# Patient Record
Sex: Male | Born: 1990 | Race: White | Hispanic: No | Marital: Single | Smoking: Former smoker
Health system: Southern US, Community
[De-identification: ages and names within clinical notes are randomized; demographics above are authoritative.]

## PROBLEM LIST (undated history)

## (undated) DIAGNOSIS — J342 Deviated nasal septum: Secondary | ICD-10-CM

## (undated) DIAGNOSIS — K219 Gastro-esophageal reflux disease without esophagitis: Secondary | ICD-10-CM

## (undated) DIAGNOSIS — Z9889 Other specified postprocedural states: Secondary | ICD-10-CM

## (undated) DIAGNOSIS — J069 Acute upper respiratory infection, unspecified: Secondary | ICD-10-CM

## (undated) HISTORY — PX: SHOULDER ARTHROSCOPY: SHX128

## (undated) HISTORY — PX: KNEE ARTHROSCOPY: SUR90

## (undated) HISTORY — DX: Acute upper respiratory infection, unspecified: J06.9

## (undated) HISTORY — PX: WISDOM TOOTH EXTRACTION: SHX21

## (undated) HISTORY — PX: FRACTURE SURGERY: SHX138

---
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
## 2010-01-08 ENCOUNTER — Ambulatory Visit
Admission: RE | Admit: 2010-01-08 | Discharge: 2010-01-08 | Payer: Self-pay | Source: Home / Self Care | Attending: Pulmonary Disease | Admitting: Pulmonary Disease

## 2010-01-08 ENCOUNTER — Encounter: Payer: Self-pay | Admitting: Pulmonary Disease

## 2010-01-08 DIAGNOSIS — R059 Cough, unspecified: Secondary | ICD-10-CM | POA: Insufficient documentation

## 2010-01-08 DIAGNOSIS — R05 Cough: Secondary | ICD-10-CM | POA: Insufficient documentation

## 2010-01-08 DIAGNOSIS — J45909 Unspecified asthma, uncomplicated: Secondary | ICD-10-CM | POA: Insufficient documentation

## 2010-02-04 NOTE — Miscellaneous (Signed)
  Clinical Lists Changes  Medications: Added new medication of EPIPEN 0.3 MG/0.3ML DEVI (EPINEPHRINE) as directed - Signed Rx of EPIPEN 0.3 MG/0.3ML DEVI (EPINEPHRINE) as directed;  #1 x 0;  Signed;  Entered by: Barbaraann Share MD;  Authorized by: Barbaraann Share MD;  Method used: Electronically to [facility] [address]. [phone]*, [address], [place], [place], [place]  [address], Ph: [phone] or [phone], Fax: [phone] pt with severe allergy to naprosyn, and s/p recent shoulder surgery.  Has been placed on meloxicam, and needs to have epi pen on hand in the event of cross reactivity to meloxicam.  Will call in new prescription for epi pen.   Prescriptions: EPIPEN 0.3 MG/0.3ML DEVI (EPINEPHRINE) as directed  #1 x 0   Entered and Authorized by:   Barbaraann Share MD   Signed by:   Barbaraann Share MD on 07/05/2009   Method used:   Electronically to        [facility] [address] [phone]* (retail)       [address]       [place], [place]  [address]       Ph: [phone] or [phone]       Fax: [phone]   RxID:   [id number]

## 2010-02-04 NOTE — Assessment & Plan Note (Signed)
Summary: chronic cough/cxr prior to ov/la   CC:  15 month ROV & eval for chronic cough....  History of Present Illness: 20 y/o WM, son of Dr. Marcelyn Bruins, and a soph at [employer]...     ~  Oct10:  states he noted the onset of sore throat  ~1week ago w/ swollen lymph nodes, low grade fever, feeling poorly, etc... also had cough- small amt of yellow brown mucous, sinus congestion, drainage & ears clogged...  went to student health w/ ZPak given & he completed this yest w/ some improvement overall but persistant sore throat, "sweeling" sensation, few tender LN's, congestion... cough & phlegm have improved- all resolved w/ Zpak, Medrol, Chlorseptic, etc...   ~  January 08, 2010:  asked to check Shayon before his ret to [employer] [facility] [employer] [facility] due to cough>  really present x yrs w/ prev eval as pre-teen by DrKozlow (neg allergy testing, neg sinus eval, & norm PFT's at that time), GI- DrMagod (neg UGI & EGD), & [facility]- abn 24H pH probe & some improvement w/ vigorous antireflux regimen (supposed to still be taking Zyrtek & Prilosec OTC daily... cough is a dry non-productive irritative cough- w/o sputum or hemoptysis, notes sl drainage, no f/c/s, etc... he denies CP, palpit, SOB, or any prob w/ activities... on note he was taking Prilosec in AM on empty stomach w/o eating after, not on any inhaled meds etc... we decided to check CXR (Clear, WNL);  and PFT (?mild obstructive defect); therefore we elected to try 82mo vigorous regimen ==> elev HOB if poss, take Dexilant before 1st meal, don't eat/ drink within 4H of retiring, ASMANEX 1sp Qhs, Nasonex Qhs, & Saline Prn daytime...   Current Problems:   ? of ALLERGIC RHINITIS (ICD-477.9) - he had neg allergy testing by DrKozlow in the past...  ~  1/12: we discussed trial Rx w/ Zyrtek Qam, Saline during the day, & NASONEX Qhs...  ACID REFLUX DISEASE (ICD-530.81) - hx chronic cough age 73 w/ eval by [facility]- pH probe was pos for reflux and symptoms improved w/ PPI therapy... he had  neg EGD/ UGI from [facility]... abn 24H pH probe test... sl delay on Gastric Emptying Scan 2009 (31% retension at [facility])... he was intermittent about PPI Rx & tended to take it early AM w/o eating afterwards...  ~  1/12:  we decided to try DEXILANT 60mg  daily- 30 min before 1st meal of the day; elev HOB; nothing to eat/ drink up to 4H prior to bed.  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 he also notes that PCN upsets his stomach...   Preventive Screening-Counseling & Management  Alcohol-Tobacco     Smoking Status: never  Allergies: 1)  ! Naprosyn 2)  ! Penicillin  Comments:  Nurse/Medical Assistant: The patient's medications and allergies were reviewed with the patient and were updated in the Medication and Allergy Lists.  Past History:  Past Medical History: ? 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 Father- Haroon Shatto - age 14, hx HBP & Chol... Mother - Latrelle Dodrill - age 79, hx PAT... 1 Sibling: sister in good health  Social History: Reviewed history from 10/20/2008 and no changes required. never smoked no alcohol  current student @  [employer] son of Dr. Marcelyn Bruins Smoking Status:  never  Review of Systems      See HPI       The patient complains of prolonged cough.  The patient denies anorexia, fever, weight loss, weight gain, vision loss, decreased hearing, hoarseness, chest pain, syncope, dyspnea on exertion, peripheral edema, headaches, hemoptysis, abdominal pain, melena, hematochezia, severe indigestion/heartburn, hematuria, incontinence, muscle weakness, suspicious skin lesions, transient blindness, difficulty  walking, depression, unusual weight change, abnormal bleeding, enlarged lymph nodes, and angioedema.    Vital Signs:  Patient profile:   20 year old male Height:      72 inches Weight:      156 pounds BMI:     21.23 O2 Sat:      97 % on Room air Temp:     99.1 degrees F oral Pulse rate:   103 / minute BP sitting:   110 / 74  (right arm) Cuff size:   regular  Vitals Entered By: Randell Loop CMA (January 08, 2010 2:11 PM)  O2 Sat at Rest %:  97 O2 Flow:  Room air CC: 15 month ROV & eval for chronic cough... Is Patient Diabetic? No Pain Assessment Patient in pain? no      Comments meds updated today with pt   Physical Exam  Additional Exam:  WD, WN, 20 y/o WM in NAD... GENERAL:  Alert & oriented; pleasant & cooperative. HEENT:  [place]/AT, EOM-wnl, PERRLA, Fundi-benign, EACs-clear x sl wax, TMs-wnl, NOSE-clear, THROAT- sl red w/o exud... NECK:  Supple w/ full ROM; no JVD; normal carotid impulses w/o bruits; no thyromegaly or nodules palpated; no adenopathy. CHEST:  Clear to P & A; without wheezes/ rales/ or rhonchi heard... HEART:  Regular Rhythm; without murmurs/ rubs/ or gallops detected... ABDOMEN:  Soft & nontender; normal bowel sounds; no organomegaly or masses palapted... EXT: without deformities or arthritic changes; no varicose veins/ venous insuffic/ or edema. NEURO:  CN's intact; motor testing normal; sensory testing normal; gait normal & balance OK. DERM:  No lesions noted; no rash etc...    CXR  Procedure date:  01/08/2010  Findings:      CHEST - 2 VIEW Comparison: None.   Findings: The lung fields are well expanded.  Heart and mediastinal contours are within normal limits.  The lung fields appear clear with no signs of focal infiltrate or congestive failure.  No pleural fluid or peribronchial cuffing is seen.   Bony structures are intact.   IMPRESSION: Normal cardiopulmonary appearance   Read By:  Bertha Stakes,  M.D.   Pre-Spirometry FVC     Value: 5.73 L/min   Pred: 5.96 L/min     % Pred: 96 % FEV1    Value: 3.31 L     Pred: 4.96 L     % Pred: 67 % FEV1/FVC  Value: 58 %     Pred: 84 %     % Pred: -- % FEF 25-75  Value: 2.73 L/min   Pred: 5.21 L/min     % Pred: 52 %  Comments: Spirometry reveals mild-mod airflow obstruction...  SN  Impression & Recommendations:  Problem # 1:  REACTIVE AIRWAY DISEASE (ICD-493.90) PFT indicates some underlying RADS> ?reflux related, ?sinus related, ?intrinsic... cough is his major symptom >> we discussed a one month trial of multisys therapy designed to see if we can diminish his cough reaction... in this regard we have asked him to: 1) attack reflux w/ Dexilant 60mg  daily- before the  1st meal of the day; elev HOB if able; don't eat/ drink w/thin 4H of retiring... 2) Rx sinus w/ Zyrtek Qam, use Saline Q1-2H during the day fopr congestion; use NASONEX Qhs... 3) Rx RADS w/ ASMANEX 1 sp at bedtime...  His updated medication list for this problem includes:    Asmanex 30 Metered Doses 220 Mcg/inh Aepb (Mometasone furoate) ..... One inhalation at bedtime...  Problem # 2:  ? of ALLERGIC RHINITIS (ICD-477.9) As above... His updated medication list for this problem includes:    Zyrtec Allergy 10 Mg Tabs (Cetirizine hcl) .Marland Kitchen... Take 1 tablet by mouth once a day    Nasonex 50 Mcg/act Susp (Mometasone furoate) .Marland Kitchen... 2 sprays in each nostril at bedtime...  Problem # 3:  ACID REFLUX DISEASE (ICD-530.81) As above... His updated medication list for this problem includes:    Dexilant 60 Mg Cpdr (Dexlansoprazole) .Marland Kitchen... Take 1 cap by mouth once daily- 30 min before the evening meal...  Complete Medication List: 1)  Epipen 0.3 Mg/0.6ml Devi (Epinephrine) .... As directed 2)  Zyrtec Allergy 10 Mg Tabs (Cetirizine hcl) .... Take 1 tablet by mouth once a day 3)  Dexilant 60 Mg Cpdr (Dexlansoprazole) .... Take 1 cap by mouth once daily- 30 min before the evening meal... 4)  Nasonex 50 Mcg/act Susp  (Mometasone furoate) .... 2 sprays in each nostril at bedtime.Marland KitchenMarland Kitchen 5)  Asmanex 30 Metered Doses 220 Mcg/inh Aepb (Mometasone furoate) .... One inhalation at bedtime...  Other Orders: T-2 View CXR (71020TC) No Charge Patient Arrived (NCPA0) (NCPA0)  Patient Instructions: 1)  To try and make a dent in your chronic cough problem- let's resolve to really give this treatment course a thorough trial over the next month: 2)  take the [facility] one inhalation at bedtime.Marland KitchenMarland Kitchen 3)  use the NASONEX 2 sprays in each nostril at bedtime... (you may also sray your nose in the AM & all day long w/ a SALINE nasal spray if you are stopped-up). 4)  take the DEXILANT one capsule 30 min before the evening meal...  5)  it would also be beneficial to elevate the head of your bed on 6" blocks... 6)  Let me know how well you were able to adhere to this regimen & how it worked for your cough & airway symptoms, then we can decide on treatment going forward.Marland KitchenMarland Kitchen

## 2010-03-22 LAB — COMPREHENSIVE METABOLIC PANEL
ALT: 13 U/L (ref 0–53)
AST: 19 U/L (ref 0–37)
CO2: 28 mEq/L (ref 19–32)
Calcium: 9.9 mg/dL (ref 8.4–10.5)
GFR calc Af Amer: >60 mL/min (ref >60)
Sodium: 137 mEq/L (ref 135–145)
Total Protein: 7.1 g/dL (ref 6.0–8.3)

## 2010-03-22 LAB — CBC
MCHC: 35.1 g/dL (ref 30.0–36.0)
RBC: 4.73 MIL/uL (ref 4.22–5.81)
RDW: 12.4 % (ref 11.5–15.5)

## 2010-03-22 LAB — URINALYSIS, ROUTINE W REFLEX MICROSCOPIC
Glucose, UA: NEGATIVE mg/dL
Specific Gravity, Urine: 1.014 (ref 1.005–1.030)
pH: 7.5 (ref 5.0–8.0)

## 2010-05-18 NOTE — Op Note (Signed)
Roberto Thomas, Roberto Thomas                 ACCOUNT NO.:  [id number]   MEDICAL RECORD NO.:  [id number]          PATIENT TYPE:  AMB   LOCATION:  ENDO                         FACILITY:  [facility]   PHYSICIAN:  Petra Kuba, M.D.    DATE OF BIRTH:  Feb 05, 1990   DATE OF PROCEDURE:  12/31/2007  DATE OF DISCHARGE:                               OPERATIVE REPORT   PROCEDURE:  Esophagogastroduodenoscopy with biopsy.   INDICATION:  Patient with nausea and vomiting, reflux.   Consent was signed after risks, benefits, methods, options thoroughly  discussed with both the patient and his mother and his father in the  past.   MEDICINES USED:  1. Fentanyl 75 mcg.  2. Versed 5 mg.   PROCEDURE:  Video endoscope was inserted by direct vision.  The  esophagus was normal.  The scope passed into the stomach and some old  vegetable matter was seen in the greater curve, small to medium in  amount.  The scope passed through a normal antrum, normal pylorus into a  normal duodenal bulb, around the C-loop to a normal second, third,  fourth and possibly even around the ligament of Treitz to the most  proximal jejunum.  On slow withdrawal back to the bulb no abnormalities  were seen.  The scope was withdrawn back to the stomach and retroflexed.  Cardia was normal.  The proximal part of the stomach was obscured with  food which could not be washed and suctioned due to the vegetable  matter, but the rest of the stomach was evaluated on straight and  retroflex visualization without obvious abnormalities.  Two biopsies of  the antrum and 2 of the proximal stomach were obtained to rule out  Helicobacter.  Air was suctioned, the scope slowly withdrawn, again a  good look at the esophagus was normal.  The scope was removed.  The  patient tolerated the procedure well.  There was no obvious immediate  complication.   ENDOSCOPIC DIAGNOSES:  1. Old food along the greater curve, could not wash and suction, small      to medium  quantity.  2. Otherwise within normal limits, questionably to past the ligament      of Treitz.   PLAN:  Await pathology to rule out Helicobacter.  Will get a gastric  emptying tomorrow.  Will need to decide either nighttime Reglan versus  erythromycin, possibly even a low dose before exercise or at-risk meals.  We will wait on the gastric emptying to start the medication which will  be tomorrow, and call me sooner p.r.n.           ______________________________  Petra Kuba, M.D.     MEM/MEDQ  D:  12/31/2007  T:  12/31/2007  Job:  [id number]   cc:   Barbaraann Share, MD,FCCP  [address]  [place]  [place] [address]

## 2011-06-15 ENCOUNTER — Other Ambulatory Visit (HOSPITAL_COMMUNITY): Payer: Self-pay | Admitting: Orthopedic Surgery

## 2011-06-15 DIAGNOSIS — M25562 Pain in left knee: Secondary | ICD-10-CM

## 2011-06-17 ENCOUNTER — Ambulatory Visit (HOSPITAL_COMMUNITY)
Admission: RE | Admit: 2011-06-17 | Discharge: 2011-06-17 | Disposition: A | Discharge status: Home / Self Care | Payer: 59 | Source: Ambulatory Visit | Attending: Orthopedic Surgery | Admitting: Orthopedic Surgery

## 2011-06-17 DIAGNOSIS — R937 Abnormal findings on diagnostic imaging of other parts of musculoskeletal system: Secondary | ICD-10-CM | POA: Insufficient documentation

## 2011-06-17 DIAGNOSIS — M25562 Pain in left knee: Secondary | ICD-10-CM

## 2011-06-17 DIAGNOSIS — M25569 Pain in unspecified knee: Secondary | ICD-10-CM | POA: Insufficient documentation

## 2011-07-13 ENCOUNTER — Encounter (HOSPITAL_COMMUNITY): Payer: Self-pay | Admitting: *Deleted

## 2011-07-13 ENCOUNTER — Emergency Department (HOSPITAL_COMMUNITY)
Admission: EM | Admit: 2011-07-13 | Discharge: 2011-07-14 | Disposition: A | Discharge status: Home / Self Care | Payer: 59 | Attending: Emergency Medicine | Admitting: Emergency Medicine

## 2011-07-13 DIAGNOSIS — Z880 Allergy status to penicillin: Secondary | ICD-10-CM | POA: Insufficient documentation

## 2011-07-13 DIAGNOSIS — I10 Essential (primary) hypertension: Secondary | ICD-10-CM | POA: Insufficient documentation

## 2011-07-13 DIAGNOSIS — T7840XA Allergy, unspecified, initial encounter: Secondary | ICD-10-CM | POA: Insufficient documentation

## 2011-07-13 MED ORDER — DIPHENHYDRAMINE HCL 50 MG/ML IJ SOLN
25.0000 mg | Freq: Once | INTRAMUSCULAR | Status: AC
  Administered 2011-07-13: 25 mg via INTRAVENOUS
  Filled 2011-07-13: qty 1

## 2011-07-13 MED ORDER — FAMOTIDINE IN NACL 20-0.9 MG/50ML-% IV SOLN
20.0000 mg | Freq: Once | INTRAVENOUS | Status: AC
  Administered 2011-07-13: 20 mg via INTRAVENOUS
  Filled 2011-07-13: qty 50

## 2011-07-13 MED ORDER — METHYLPREDNISOLONE SODIUM SUCC 125 MG IJ SOLR
125.0000 mg | Freq: Once | INTRAMUSCULAR | Status: AC
  Administered 2011-07-13: 125 mg via INTRAVENOUS
  Filled 2011-07-13: qty 2

## 2011-07-13 NOTE — ED Notes (Signed)
Pt in c/o possible allergic reaction, states he came home from work and noted a rash, feels like his tongue is swelling and his throat feels tight and scratchy

## 2011-07-14 MED ORDER — FAMOTIDINE 20 MG PO TABS: 20.0000 mg | ORAL_TABLET | Freq: Two times a day (BID) | ORAL | Status: DC

## 2011-07-14 MED ORDER — DIPHENHYDRAMINE HCL 25 MG PO TABS: 50.0000 mg | ORAL_TABLET | Freq: Three times a day (TID) | ORAL | Status: DC | PRN

## 2011-07-14 MED ORDER — PREDNISONE 50 MG PO TABS: 50.0000 mg | ORAL_TABLET | Freq: Every day | ORAL | Status: AC

## 2011-07-14 NOTE — ED Provider Notes (Signed)
History     CSN: [id number]  Arrival date & time 07/13/11  2224   First MD Initiated Contact with Patient 07/13/11 2232      Chief Complaint  Patient presents with  . Allergic Reaction    Patient is a 21 y.o. male presenting with allergic reaction. The history is provided by the patient.  Allergic Reaction The primary symptoms are  rash. The primary symptoms do not include wheezing, shortness of breath, nausea, vomiting, angioedema or urticaria. Primary symptoms comment: He feels like his tongue and throat are tight and tingling. Episode onset: after coming home from work this evening. The problem has not changed since onset.Chronicity: Pt does have a history of having allergic reactions felt to be related to naprosyn in the  past.  This feels similar.  The rash began today. The rash appears on the torso. The pain associated with the rash is mild. The rash is associated with itching. The rash is not associated with blisters or weeping.  Associated with: no new meds, insect bites, stings. Significant symptoms also include itching.    History reviewed. No pertinent past medical history.  No past surgical history on file.  No family history on file.  History  Substance Use Topics  . Smoking status: Not on file  . Smokeless tobacco: Not on file  . Alcohol Use: Not on file      Review of Systems  Respiratory: Negative for shortness of breath and wheezing.   Gastrointestinal: Negative for nausea and vomiting.  Skin: Positive for itching and rash.  All other systems reviewed and are negative.    Allergies  Naproxen and Penicillins  Home Medications   Current Outpatient Rx  Name Route Sig Dispense Refill  . DIPHENHYDRAMINE HCL 25 MG PO TABS Oral Take 2 tablets (50 mg total) by mouth every 8 (eight) hours as needed for itching. 21 tablet 0  . FAMOTIDINE 20 MG PO TABS Oral Take 1 tablet (20 mg total) by mouth 2 (two) times daily. 14 tablet 0  . PREDNISONE 50 MG PO TABS Oral  Take 1 tablet (50 mg total) by mouth daily. 5 tablet 0    Dispense as written.    BP 136/74  Pulse 77  Temp 98.9 F (37.2 C) (Oral)  Resp 18  SpO2 98%  Physical Exam  Nursing note and vitals reviewed. Constitutional: He appears well-developed and well-nourished. No distress.  HENT:  Head: Normocephalic and atraumatic.  Right Ear: External ear normal.  Left Ear: External ear normal.  Mouth/Throat: Oropharynx is clear and moist. No uvula swelling. No oropharyngeal exudate.  Eyes: Conjunctivae are normal. Right eye exhibits no discharge. Left eye exhibits no discharge. No scleral icterus.  Neck: Neck supple. No tracheal deviation present.  Cardiovascular: Normal rate, regular rhythm and intact distal pulses.   Pulmonary/Chest: Effort normal and breath sounds normal. No stridor. No respiratory distress. He has no wheezes. He has no rales.  Abdominal: Soft. Bowel sounds are normal. He exhibits no distension. There is no tenderness. There is no rebound and no guarding.  Musculoskeletal: He exhibits no edema and no tenderness.  Neurological: He is alert. He has normal strength. No sensory deficit. Cranial nerve deficit:  no gross defecits noted. He exhibits normal muscle tone. He displays no seizure activity. Coordination normal.  Skin: Skin is warm and dry. Rash noted. No petechiae and no purpura noted. Rash is papular. Rash is not vesicular and not urticarial. He is not diaphoretic.  Psychiatric: He has a  normal mood and affect.    ED Course  Procedures (including critical care time)   No results found.   1. Allergic reaction      Medications  diphenhydrAMINE (BENADRYL) 25 MG tablet (not administered)  famotidine (PEPCID) 20 MG tablet (not administered)  predniSONE (DELTASONE) 50 MG tablet (not administered)  diphenhydrAMINE (BENADRYL) injection 25 mg (25 mg Intravenous Given 07/13/11 2300)  famotidine (PEPCID) IVPB 20 mg (20 mg Intravenous Given 07/13/11 2303)    methylPREDNISolone sodium succinate (SOLU-MEDROL) 125 mg/2 mL injection 125 mg (125 mg Intravenous Given 07/13/11 2301)     MDM  Pt responded to treatment.  No sign of angioedema.  Will dc home on meds.  Discussed treatment with patient and mother.  Not definitely urticarial based on exam        Celene Kras, MD 07/14/11 0009

## 2012-03-12 ENCOUNTER — Telehealth: Payer: Self-pay | Admitting: Pulmonary Disease

## 2012-03-12 MED ORDER — ONDANSETRON 4 MG PO TBDP: 4.0000 mg | ORAL_TABLET | Freq: Three times a day (TID) | ORAL | Status: DC | PRN

## 2012-03-12 NOTE — Telephone Encounter (Signed)
Pt developed intractable nausea and vomiting around 1am and associated with green diarrhea.  No coffee grounds or frank blood.  No bloody diarrhea.  No f/c/s.  Has continued to have and is miserable. BP ok, mild tachy at 105, afebrile. abd soft, NT, ND, bs+ Alert and oriented. Moves all 4.   Probable viral gastroenteritis.  Will call in zofran to help with intractable n/v.  If no improvement in 24 hrs, will need to see his primary md.

## 2012-11-16 ENCOUNTER — Other Ambulatory Visit (HOSPITAL_COMMUNITY): Payer: Self-pay | Admitting: Otolaryngology

## 2012-11-16 ENCOUNTER — Encounter (HOSPITAL_COMMUNITY): Payer: Self-pay

## 2012-11-16 ENCOUNTER — Ambulatory Visit (HOSPITAL_COMMUNITY)
Admission: RE | Admit: 2012-11-16 | Discharge: 2012-11-16 | Disposition: A | Discharge status: Home / Self Care | Payer: 59 | Source: Ambulatory Visit | Attending: Otolaryngology | Admitting: Otolaryngology

## 2012-11-16 DIAGNOSIS — J342 Deviated nasal septum: Secondary | ICD-10-CM | POA: Insufficient documentation

## 2012-12-12 ENCOUNTER — Encounter (HOSPITAL_BASED_OUTPATIENT_CLINIC_OR_DEPARTMENT_OTHER): Payer: Self-pay | Admitting: *Deleted

## 2012-12-19 ENCOUNTER — Ambulatory Visit (HOSPITAL_BASED_OUTPATIENT_CLINIC_OR_DEPARTMENT_OTHER)
Admission: RE | Admit: 2012-12-19 | Discharge: 2012-12-19 | Disposition: A | Discharge status: Home / Self Care | Payer: 59 | Source: Ambulatory Visit | Attending: Otolaryngology | Admitting: Otolaryngology

## 2012-12-19 ENCOUNTER — Ambulatory Visit (HOSPITAL_BASED_OUTPATIENT_CLINIC_OR_DEPARTMENT_OTHER): Payer: 59 | Admitting: Certified Registered"

## 2012-12-19 ENCOUNTER — Encounter (HOSPITAL_BASED_OUTPATIENT_CLINIC_OR_DEPARTMENT_OTHER)
Admission: RE | Disposition: A | Discharge status: Home / Self Care | Payer: Self-pay | Source: Ambulatory Visit | Attending: Otolaryngology

## 2012-12-19 ENCOUNTER — Encounter (HOSPITAL_BASED_OUTPATIENT_CLINIC_OR_DEPARTMENT_OTHER): Payer: 59 | Admitting: Certified Registered"

## 2012-12-19 ENCOUNTER — Encounter (HOSPITAL_BASED_OUTPATIENT_CLINIC_OR_DEPARTMENT_OTHER): Payer: Self-pay | Admitting: Otolaryngology

## 2012-12-19 DIAGNOSIS — K219 Gastro-esophageal reflux disease without esophagitis: Secondary | ICD-10-CM | POA: Insufficient documentation

## 2012-12-19 DIAGNOSIS — J343 Hypertrophy of nasal turbinates: Secondary | ICD-10-CM | POA: Insufficient documentation

## 2012-12-19 DIAGNOSIS — J342 Deviated nasal septum: Secondary | ICD-10-CM | POA: Diagnosis present

## 2012-12-19 HISTORY — DX: Gastro-esophageal reflux disease without esophagitis: K21.9

## 2012-12-19 HISTORY — DX: Deviated nasal septum: J34.2

## 2012-12-19 HISTORY — PX: NASAL SEPTOPLASTY W/ TURBINOPLASTY: SHX2070

## 2012-12-19 LAB — POCT HEMOGLOBIN-HEMACUE: Hemoglobin: 15.1 g/dL (ref 13.0–17.0)

## 2012-12-19 SURGERY — SEPTOPLASTY, NOSE, WITH NASAL TURBINATE REDUCTION
Anesthesia: General | Site: Nose | Laterality: Bilateral

## 2012-12-19 MED ORDER — MIDAZOLAM HCL 2 MG/2ML IJ SOLN: 1.0000 mg | INTRAMUSCULAR | Status: DC | PRN

## 2012-12-19 MED ORDER — OXYMETAZOLINE HCL 0.05 % NA SOLN
NASAL | Status: AC
  Filled 2012-12-19: qty 15

## 2012-12-19 MED ORDER — SCOPOLAMINE 1 MG/3DAYS TD PT72
MEDICATED_PATCH | TRANSDERMAL | Status: AC
  Filled 2012-12-19: qty 1

## 2012-12-19 MED ORDER — FENTANYL CITRATE 0.05 MG/ML IJ SOLN: 50.0000 ug | INTRAMUSCULAR | Status: DC | PRN

## 2012-12-19 MED ORDER — SCOPOLAMINE 1 MG/3DAYS TD PT72
1.0000 | MEDICATED_PATCH | TRANSDERMAL | Status: DC
  Administered 2012-12-19: 1.5 mg via TRANSDERMAL

## 2012-12-19 MED ORDER — OXYMETAZOLINE HCL 0.05 % NA SOLN
NASAL | Status: DC | PRN
  Administered 2012-12-19: 1 via NASAL

## 2012-12-19 MED ORDER — MIDAZOLAM HCL 5 MG/5ML IJ SOLN
INTRAMUSCULAR | Status: DC | PRN
  Administered 2012-12-19: 2 mg via INTRAVENOUS

## 2012-12-19 MED ORDER — LIDOCAINE HCL (CARDIAC) 20 MG/ML IV SOLN
INTRAVENOUS | Status: DC | PRN
  Administered 2012-12-19: 80 mg via INTRAVENOUS

## 2012-12-19 MED ORDER — LACTATED RINGERS IV SOLN
INTRAVENOUS | Status: DC
  Administered 2012-12-19 (×2): via INTRAVENOUS

## 2012-12-19 MED ORDER — MUPIROCIN CALCIUM 2 % EX CREA
TOPICAL_CREAM | CUTANEOUS | Status: DC | PRN
  Administered 2012-12-19: 1 via TOPICAL

## 2012-12-19 MED ORDER — ONDANSETRON 4 MG PO TBDP: 4.0000 mg | ORAL_TABLET | Freq: Three times a day (TID) | ORAL | Status: DC | PRN

## 2012-12-19 MED ORDER — SUCCINYLCHOLINE CHLORIDE 20 MG/ML IJ SOLN
INTRAMUSCULAR | Status: DC | PRN
  Administered 2012-12-19: 100 mg via INTRAVENOUS

## 2012-12-19 MED ORDER — HYDROMORPHONE HCL PF 1 MG/ML IJ SOLN
0.2500 mg | INTRAMUSCULAR | Status: DC | PRN
  Administered 2012-12-19 (×2): 0.5 mg via INTRAVENOUS

## 2012-12-19 MED ORDER — OXYCODONE HCL 5 MG/5ML PO SOLN: 5.0000 mg | Freq: Once | ORAL | Status: AC | PRN

## 2012-12-19 MED ORDER — MIDAZOLAM HCL 2 MG/2ML IJ SOLN
INTRAMUSCULAR | Status: AC
  Filled 2012-12-19: qty 2

## 2012-12-19 MED ORDER — BACITRACIN ZINC 500 UNIT/GM EX OINT
TOPICAL_OINTMENT | CUTANEOUS | Status: AC
  Filled 2012-12-19: qty 28.35

## 2012-12-19 MED ORDER — OXYCODONE HCL 5 MG PO TABS
ORAL_TABLET | ORAL | Status: AC
  Filled 2012-12-19: qty 1

## 2012-12-19 MED ORDER — FENTANYL CITRATE 0.05 MG/ML IJ SOLN
INTRAMUSCULAR | Status: AC
  Filled 2012-12-19: qty 6

## 2012-12-19 MED ORDER — HYDROMORPHONE HCL PF 1 MG/ML IJ SOLN
INTRAMUSCULAR | Status: AC
  Filled 2012-12-19: qty 1

## 2012-12-19 MED ORDER — CEFAZOLIN SODIUM-DEXTROSE 2-3 GM-% IV SOLR: 2000.0000 mg | Freq: Once | INTRAVENOUS | Status: DC

## 2012-12-19 MED ORDER — OXYCODONE HCL 5 MG PO TABS
5.0000 mg | ORAL_TABLET | Freq: Once | ORAL | Status: AC | PRN
  Administered 2012-12-19: 5 mg via ORAL

## 2012-12-19 MED ORDER — HYDROCODONE-ACETAMINOPHEN 5-325 MG PO TABS: 1.0000 | ORAL_TABLET | Freq: Four times a day (QID) | ORAL | Status: DC | PRN

## 2012-12-19 MED ORDER — FENTANYL CITRATE 0.05 MG/ML IJ SOLN
INTRAMUSCULAR | Status: DC | PRN
  Administered 2012-12-19: 100 ug via INTRAVENOUS

## 2012-12-19 MED ORDER — ONDANSETRON HCL 4 MG/2ML IJ SOLN
INTRAMUSCULAR | Status: DC | PRN
  Administered 2012-12-19: 4 mg via INTRAVENOUS

## 2012-12-19 MED ORDER — CEPHALEXIN 500 MG PO CAPS: 500.0000 mg | ORAL_CAPSULE | Freq: Three times a day (TID) | ORAL | Status: DC

## 2012-12-19 MED ORDER — DEXAMETHASONE SODIUM PHOSPHATE 4 MG/ML IJ SOLN
INTRAMUSCULAR | Status: DC | PRN
  Administered 2012-12-19: 10 mg via INTRAVENOUS

## 2012-12-19 MED ORDER — MUPIROCIN 2 % EX OINT
TOPICAL_OINTMENT | CUTANEOUS | Status: AC
  Filled 2012-12-19: qty 22

## 2012-12-19 MED ORDER — LIDOCAINE-EPINEPHRINE 1 %-1:100000 IJ SOLN
INTRAMUSCULAR | Status: DC | PRN
  Administered 2012-12-19: 8 mL

## 2012-12-19 MED ORDER — PROPOFOL 10 MG/ML IV BOLUS
INTRAVENOUS | Status: DC | PRN
  Administered 2012-12-19: 300 mg via INTRAVENOUS

## 2012-12-19 MED ORDER — ONDANSETRON HCL 4 MG/2ML IJ SOLN: 4.0000 mg | Freq: Once | INTRAMUSCULAR | Status: DC | PRN

## 2012-12-19 SURGICAL SUPPLY — 33 items
ATTRACTOMAT 16X20 MAGNETIC DRP (DRAPES) ×2 IMPLANT
BLADE SURG 15 STRL LF DISP TIS (BLADE) ×1 IMPLANT
BLADE SURG 15 STRL SS (BLADE) ×1
CANISTER SUCT 1200ML W/VALVE (MISCELLANEOUS) ×2 IMPLANT
COAGULATOR SUCT 8FR VV (MISCELLANEOUS) IMPLANT
DECANTER SPIKE VIAL GLASS SM (MISCELLANEOUS) IMPLANT
DRSG NASOPORE 8CM (GAUZE/BANDAGES/DRESSINGS) IMPLANT
DRSG TELFA 3X8 NADH (GAUZE/BANDAGES/DRESSINGS) IMPLANT
ELECT REM PT RETURN 9FT ADLT (ELECTROSURGICAL)
ELECTRODE REM PT RTRN 9FT ADLT (ELECTROSURGICAL) IMPLANT
GLOVE BIOGEL M 7.0 STRL (GLOVE) ×4 IMPLANT
GLOVE BIOGEL PI IND STRL 6.5 (GLOVE) ×1 IMPLANT
GLOVE BIOGEL PI IND STRL 7.0 (GLOVE) ×1 IMPLANT
GLOVE BIOGEL PI INDICATOR 6.5 (GLOVE) ×1
GLOVE BIOGEL PI INDICATOR 7.0 (GLOVE) ×1
GLOVE ECLIPSE 6.5 STRL STRAW (GLOVE) ×4 IMPLANT
GOWN PREVENTION PLUS XLARGE (GOWN DISPOSABLE) ×4 IMPLANT
NEEDLE 27GAX1X1/2 (NEEDLE) ×2 IMPLANT
NS IRRIG 1000ML POUR BTL (IV SOLUTION) ×2 IMPLANT
PACK BASIN DAY SURGERY FS (CUSTOM PROCEDURE TRAY) ×2 IMPLANT
PACK ENT DAY SURGERY (CUSTOM PROCEDURE TRAY) ×2 IMPLANT
SET EXT MALE ROTATING LL 32IN (MISCELLANEOUS) ×2 IMPLANT
SLEEVE SCD COMPRESS KNEE MED (MISCELLANEOUS) IMPLANT
SPLINT NASAL DOYLE BI-VL (GAUZE/BANDAGES/DRESSINGS) ×2 IMPLANT
SPONGE GAUZE 2X2 8PLY STRL LF (GAUZE/BANDAGES/DRESSINGS) ×2 IMPLANT
SPONGE NEURO XRAY DETECT 1X3 (DISPOSABLE) ×2 IMPLANT
SPONGE SURGIFOAM ABS GEL 12-7 (HEMOSTASIS) IMPLANT
SUT ETHILON 3 0 PS 1 (SUTURE) ×2 IMPLANT
SUT PLAIN 4 0 ~~LOC~~ 1 (SUTURE) ×2 IMPLANT
TOWEL OR 17X24 6PK STRL BLUE (TOWEL DISPOSABLE) ×2 IMPLANT
TUBE SALEM SUMP 12R W/ARV (TUBING) IMPLANT
TUBE SALEM SUMP 16 FR W/ARV (TUBING) ×2 IMPLANT
YANKAUER SUCT BULB TIP NO VENT (SUCTIONS) ×2 IMPLANT

## 2012-12-19 NOTE — Brief Op Note (Signed)
12/19/2012  1:44 PM  PATIENT:  Roberto Thomas  22 y.o. male  PRE-OPERATIVE DIAGNOSIS:  DEVIATED SEPTUM  POST-OPERATIVE DIAGNOSIS:  DEVIATED SEPTUM  PROCEDURE:  Procedure(s): BILATERAL NASAL SEPTOPLASTY WITH BIALTERAL TURBINATE REDUCTION (Bilateral)  SURGEON:  Surgeon(s) and Role:    * Osborn Coho, MD - Primary  PHYSICIAN ASSISTANT:   ASSISTANTS: none   ANESTHESIA:   general  EBL:  Total I/O In: 1350 [I.V.:1350] Out: - EBL: <50cc  BLOOD ADMINISTERED:none  DRAINS: none   LOCAL MEDICATIONS USED:  LIDOCAINE  and Amount: 8 ml  SPECIMEN:  No Specimen  DISPOSITION OF SPECIMEN:  N/A  COUNTS:  YES  TOURNIQUET:  * No tourniquets in log *  DICTATION: .Other Dictation: Dictation Number [id number]  PLAN OF CARE: Discharge to home after PACU  PATIENT DISPOSITION:  PACU - hemodynamically stable.   Delay start of Pharmacological VTE agent (>24hrs) due to surgical blood loss or risk of bleeding: not applicable

## 2012-12-19 NOTE — Anesthesia Postprocedure Evaluation (Signed)
Anesthesia Post Note  Patient: Roberto Thomas  Procedure(s) Performed: Procedure(s) (LRB): BILATERAL NASAL SEPTOPLASTY WITH BIALTERAL TURBINATE REDUCTION (Bilateral)  Anesthesia type: general  Patient location: PACU  Post pain: Pain level controlled  Post assessment: Patient's Cardiovascular Status Stable  Last Vitals:  Filed Vitals:   12/19/12 1500  BP: 132/99  Pulse: 72  Temp:   Resp: 19    Post vital signs: Reviewed and stable  Level of consciousness: sedated  Complications: No apparent anesthesia complications

## 2012-12-19 NOTE — Anesthesia Preprocedure Evaluation (Signed)
Anesthesia Evaluation  Patient identified by MRN, date of birth, ID band Patient awake    Reviewed: Allergy & Precautions, H&P , NPO status , Patient's Chart, lab work & pertinent test results  History of Anesthesia Complications (+) PONV  Airway Mallampati: I TM Distance: >3 FB Neck ROM: Full    Dental  (+) Teeth Intact and Dental Advisory Given   Pulmonary asthma ,  breath sounds clear to auscultation        Cardiovascular Rhythm:Regular Rate:Normal     Neuro/Psych    GI/Hepatic GERD-  Medicated and Controlled,  Endo/Other    Renal/GU      Musculoskeletal   Abdominal   Peds  Hematology   Anesthesia Other Findings   Reproductive/Obstetrics                           Anesthesia Physical Anesthesia Plan  ASA: II  Anesthesia Plan: General   Post-op Pain Management:    Induction: Intravenous  Airway Management Planned: Oral ETT  Additional Equipment:   Intra-op Plan:   Post-operative Plan: Extubation in OR  Informed Consent: I have reviewed the patients History and Physical, chart, labs and discussed the procedure including the risks, benefits and alternatives for the proposed anesthesia with the patient or authorized representative who has indicated his/her understanding and acceptance.   Dental advisory given  Plan Discussed with: CRNA, Anesthesiologist and Surgeon  Anesthesia Plan Comments:         Anesthesia Quick Evaluation

## 2012-12-19 NOTE — Transfer of Care (Signed)
Immediate Anesthesia Transfer of Care Note  Patient: Roberto Thomas  Procedure(s) Performed: Procedure(s): BILATERAL NASAL SEPTOPLASTY WITH BIALTERAL TURBINATE REDUCTION (Bilateral)  Patient Location: PACU  Anesthesia Type:General  Level of Consciousness: awake, alert , oriented and patient cooperative  Airway & Oxygen Therapy: Patient Spontanous Breathing and Patient connected to face mask oxygen  Post-op Assessment: Report given to PACU RN, Post -op Vital signs reviewed and stable and Patient moving all extremities  Post vital signs: Reviewed and stable  Complications: No apparent anesthesia complications

## 2012-12-19 NOTE — H&P (Signed)
Roberto Thomas is an 22 y.o. male.   Chief Complaint: Nasal Obstruction HPI: prg nasal obstruction  Past Medical History  Diagnosis Date  . GERD (gastroesophageal reflux disease)   . Deviated septum     Past Surgical History  Procedure Laterality Date  . Fracture surgery Right     History reviewed. No pertinent family history. Social History:  reports that he has never smoked. He does not have any smokeless tobacco history on file. He reports that he drinks alcohol. He reports that he does not use illicit drugs.  Allergies:  Allergies  Allergen Reactions  . Naproxen     REACTION: anaphylaxis  . Penicillins     REACTION: GI upset/vomiting  . Sulfa Antibiotics Rash    No prescriptions prior to admission    No results found for this or any previous visit (from the past 48 hour(s)). No results found.  Review of Systems  Constitutional: Negative.   HENT: Negative.   Respiratory: Positive for cough.   Cardiovascular: Negative.   Gastrointestinal: Positive for heartburn.    Height 6\' 1"  (1.854 m), weight 72.576 kg (160 lb). Physical Exam  Constitutional: He is oriented to person, place, and time. He appears well-developed and well-nourished.  Neck: Normal range of motion. Neck supple.  Cardiovascular: Normal rate.   Respiratory: Effort normal.  GI: Soft.  Musculoskeletal: Normal range of motion.  Neurological: He is alert and oriented to person, place, and time.     Assessment/Plan Adm for OP nasal surgery under GA  Zacharias Ridling 12/19/2012, 8:36 AM

## 2012-12-19 NOTE — Anesthesia Procedure Notes (Signed)
Procedure Name: Intubation Date/Time: 12/19/2012 12:48 PM Performed by: Burna Cash Pre-anesthesia Checklist: Patient identified, Emergency Drugs available, Suction available and Patient being monitored Patient Re-evaluated:Patient Re-evaluated prior to inductionOxygen Delivery Method: Circle System Utilized Preoxygenation: Pre-oxygenation with 100% oxygen Intubation Type: IV induction Ventilation: Mask ventilation without difficulty Laryngoscope Size: Mac and 3 Grade View: Grade I Tube size: 8.0 mm Number of attempts: 1 Airway Equipment and Method: stylet and oral airway Placement Confirmation: ETT inserted through vocal cords under direct vision,  positive ETCO2 and breath sounds checked- equal and bilateral Secured at: 22 cm Tube secured with: Tape Dental Injury: Teeth and Oropharynx as per pre-operative assessment

## 2012-12-20 ENCOUNTER — Encounter (HOSPITAL_BASED_OUTPATIENT_CLINIC_OR_DEPARTMENT_OTHER): Payer: Self-pay | Admitting: Otolaryngology

## 2012-12-20 NOTE — Op Note (Signed)
NAMESHAQUILLE, [name] NO.:  [id number]  MEDICAL RECORD NO.:  [id number]  LOCATION:                               FACILITY:  [facility]  PHYSICIAN:  Kinnie Scales. Annalee Genta, M.D.DATE OF BIRTH:  [birth year]  DATE OF PROCEDURE:  12/19/2012 DATE OF DISCHARGE:  12/19/2012                              OPERATIVE REPORT   PREOPERATIVE DIAGNOSES: 1. 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 2. Bilateral inferior turbinate hypertrophy.  POSTOPERATIVE DIAGNOSES: 1. 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 2. Bilateral inferior turbinate hypertrophy.  INDICATION FOR SURGERY: 1. 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 2. Bilateral inferior turbinate hypertrophy.  PROCEDURE: 1. Nasal septoplasty. 2. Bilateral inferior turbinate reduction.  ANESTHESIA:  General endotracheal.  SURGEON:  Kinnie Scales. Annalee Genta, MD  COMPLICATIONS:  None.  BLOOD LOSS:  Less than 50 mL.  The patient transferred from the operating room to recovery room in stable condition.  BRIEF HISTORY:  The patient is a 22 year old, white male, who was referred to our office for evaluation of progressive symptoms of nasal airway obstruction, chronic postnasal discharge, and cough.  The patient has a history of significant gastroesophageal reflux, and had been treated with appropriate medical therapy.  Examination showed severe left nasal septal deviation with greater than 90% airway obstruction.  A CT scan of the sinus was obtained to rule out any other abnormality or infections.  CT was normal with the exception of a severely deviated septum and turbinate hypertrophy.  Given the patient's history and findings and failure to respond to appropriate medical therapy, I recommended that he undertake nasal septoplasty and bilateral inferior turbinate reduction.  The risks and benefits of the procedure were discussed in detail with the patient and his parents and they  understood and concurred with our plan for surgery which is scheduled under general anesthesia as an outpatient at [facility].  DESCRIPTION OF PROCEDURE:  The patient was brought to the operating room on December 19, 2012, placed in supine position on the operating table. General endotracheal anesthesia was established without difficulty. When he was adequately anesthetized, the patient was positioned and prepped and draped in a sterile fashion.  His nose was injected with a total of 8 mL of 1% lidocaine 1:100,000 solution epinephrine injected in submucosal fashion along the nasal septum and inferior turbinates bilaterally.  The nose was then packed with Afrin-soaked cotton pledgets were left in place for approximately 10 minutes to allow for vasoconstriction and hemostasis.  Procedure was begun by creating a left anterior hemitransfixion incision which was carried through the mucosa and underlying submucosa and a mucoperichondrial flap was elevated from anterior to posterior on the left-hand side.  The anterior cartilaginous septum was crossed, and a mucoperichondrial flap was elevated on the right.  Deviated bone and cartilage were then removed from the nasal septum.  The anterior mid aspect of the cartilaginous septum was removed, this was later morselized and returned to the mucoperichondrial pocket.  Dissection was then carried from anterior to posterior.  There was a severe bony septal deviation and bony septal spur which was obstructing the left nasal passage.  This was mobilized  with a through cutting forceps and carefully removed bringing the septum to a good midline position.  The cartilage was morselized and returned to the mucoperichondrial pocket and the flaps were reapproximated with a 4-0 gut suture on a Keith needle in a horizontal mattress fashion.  The anterior hemitransfixion incision was closed with the same stitch.  At the conclusion of  the procedure, bilateral Doyle nasal septal splints were placed after the application of Bactroban ointment and were sutured in position with a 3- 0 Ethilon suture.  Inferior turbinate reduction was then performed with cautery set at 12 watts.  Two submucosal passes were made in each inferior turbinate. Anterior incisions were created and overlying soft tissue elevated, a small amount of anterior turbinate bone was resected bilaterally.  The turbinates were then outfractured creating more patent nasal cavity.  Nasal cavity was irrigated and suctioned and orogastric tube was passed. The stomach contents were aspirated.  The patient was then awakened from his anesthetic, he was extubated and transferred from the operating room to the recovery room in stable condition.  There were no complications and blood loss was less than 50 mL.          ______________________________ Kinnie Scales. Annalee Genta, M.D.     DLS/MEDQ  D:  91/[id number]/[id number]  T:  12/20/2012  Job:  [id number]

## 2012-12-20 NOTE — Op Note (Deleted)
NAME:  Roberto Thomas, Roberto Thomas                 ACCOUNT NO.:  [id number]  MEDICAL RECORD NO.:  [id number]  LOCATION:                               FACILITY:  [facility]  PHYSICIAN:  Gaynor Genco L. Gerica Koble, M.D.DATE OF BIRTH:  06/08/1990  DATE OF PROCEDURE:  12/19/2012 DATE OF DISCHARGE:  12/19/2012                              OPERATIVE REPORT   PREOPERATIVE DIAGNOSES: 1. 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 2. Bilateral inferior turbinate hypertrophy.  POSTOPERATIVE DIAGNOSES: 1. 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 2. Bilateral inferior turbinate hypertrophy.  INDICATION FOR SURGERY: 1. 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 2. Bilateral inferior turbinate hypertrophy.  PROCEDURE: 1. Nasal septoplasty. 2. Bilateral inferior turbinate reduction.  ANESTHESIA:  General endotracheal.  SURGEON:  Aurilla Coulibaly L. Layonna Dobie, MD  COMPLICATIONS:  None.  BLOOD LOSS:  Less than 50 mL.  The patient transferred from the operating room to recovery room in stable condition.  BRIEF HISTORY:  The patient is a 22-year-old, white male, who was referred to our office for evaluation of progressive symptoms of nasal airway obstruction, chronic postnasal discharge, and cough.  The patient has a history of significant gastroesophageal reflux, and had been treated with appropriate medical therapy.  Examination showed severe left nasal septal deviation with greater than 90% airway obstruction.  A CT scan of the sinus was obtained to rule out any other abnormality or infections.  CT was normal with the exception of a severely deviated septum and turbinate hypertrophy.  Given the patient's history and findings and failure to respond to appropriate medical therapy, I recommended that he undertake nasal septoplasty and bilateral inferior turbinate reduction.  The risks and benefits of the procedure were discussed in detail with the patient and his parents and they  understood and concurred with our plan for surgery which is scheduled under general anesthesia [facility] an [facility].  DESCRIPTION OF PROCEDURE:  The patient was brought to the operating room on December 19, 2012, placed in supine position on the operating table. General endotracheal anesthesia was established without difficulty. When he was adequately anesthetized, the patient was positioned and prepped and draped in a sterile fashion.  His nose was injected with a total of 8 mL of 1% lidocaine 1:100,000 solution epinephrine injected in submucosal fashion along the nasal septum and inferior turbinates bilaterally.  The nose was then packed with Afrin-soaked cotton pledgets were left in place for approximately 10 minutes to allow for vasoconstriction and hemostasis.  Procedure was begun by creating a left anterior hemitransfixion incision which was carried through the mucosa and underlying submucosa and a mucoperichondrial flap was elevated from anterior to posterior on the left-hand side.  The anterior cartilaginous septum was crossed, and a mucoperichondrial flap was elevated on the right.  Deviated bone and cartilage were then removed from the nasal septum.  The anterior mid aspect of the cartilaginous septum was removed, this was later morselized and returned to the mucoperichondrial pocket.  Dissection was then carried from anterior to posterior.  There was a severe bony septal deviation and bony septal spur which was obstructing the left nasal passage.  This was mobilized   with a through cutting forceps and carefully removed bringing the septum to a good midline position.  The cartilage was morselized and returned to the mucoperichondrial pocket and the flaps were reapproximated with a 4-0 gut suture on a Keith needle in a horizontal mattress fashion.  The anterior hemitransfixion incision was closed with the same stitch.  At the conclusion of  the procedure, bilateral Doyle nasal septal splints were placed after the application of Bactroban ointment and were sutured in position with a 3- 0 Ethilon suture.  Inferior turbinate reduction was then performed with cautery set at 12 watts.  Two submucosal passes were made in each inferior turbinate. Anterior incisions were created and overlying soft tissue elevated, a small amount of anterior turbinate bone was resected bilaterally.  The turbinates were then outfractured creating more patent nasal cavity.  Nasal cavity was irrigated and suctioned and orogastric tube was passed. The stomach contents were aspirated.  The patient was then awakened from his anesthetic, he was extubated and transferred from the operating room to the recovery room in stable condition.  There were no complications and blood loss was less than 50 mL.          ______________________________ [name], M.D.     DLS/MEDQ  D:  12/19/2012  T:  12/20/2012  Job:  [id number] 

## 2013-02-01 ENCOUNTER — Other Ambulatory Visit: Payer: Self-pay | Admitting: Pulmonary Disease

## 2013-02-01 MED ORDER — DEXLANSOPRAZOLE 60 MG PO CPDR: 60.0000 mg | DELAYED_RELEASE_CAPSULE | Freq: Every day | ORAL | Status: DC

## 2013-02-01 NOTE — Telephone Encounter (Signed)
[facility] for the pt [facility]

## 2013-07-27 ENCOUNTER — Encounter (HOSPITAL_COMMUNITY): Payer: Self-pay | Admitting: Emergency Medicine

## 2013-07-27 ENCOUNTER — Emergency Department (HOSPITAL_COMMUNITY)
Admission: EM | Admit: 2013-07-27 | Discharge: 2013-07-27 | Disposition: A | Discharge status: Home / Self Care | Payer: 59 | Source: Home / Self Care | Attending: Family Medicine | Admitting: Family Medicine

## 2013-07-27 DIAGNOSIS — R197 Diarrhea, unspecified: Secondary | ICD-10-CM

## 2013-07-27 MED ORDER — DIPHENOXYLATE-ATROPINE 2.5-0.025 MG PO TABS: 2.0000 | ORAL_TABLET | Freq: Four times a day (QID) | ORAL | Status: DC | PRN

## 2013-07-27 NOTE — ED Notes (Signed)
Pt reports diarrhea x 1 week w/6-7 loose stools each day Sx also include mild abd pain and nausea Denies f/v, urinary sx Alert w/no signs of acute distress.

## 2013-07-27 NOTE — Discharge Instructions (Signed)
Take probiotic and liquid diet as discussed until improved, return as needed.

## 2013-07-27 NOTE — ED Provider Notes (Signed)
CSN: [id number]     Arrival date & time 07/27/13  1506 History   First MD Initiated Contact with Patient 07/27/13 1555     Chief Complaint  Patient presents with  . Diarrhea   (Consider location/radiation/quality/duration/timing/severity/associated sxs/prior Treatment) Patient is a 23 y.o. male presenting with diarrhea. The history is provided by the patient.  Diarrhea Quality:  Watery and semi-solid Severity:  Mild Onset quality:  Gradual Number of episodes:  6-7 Duration:  1 week Progression:  Unchanged Associated symptoms: no abdominal pain, no chills, no fever and no vomiting   Risk factors: no sick contacts, no suspicious food intake and no travel to endemic areas     Past Medical History  Diagnosis Date  . GERD (gastroesophageal reflux disease)   . Deviated septum    Past Surgical History  Procedure Laterality Date  . Fracture surgery Right   . Nasal septoplasty w/ turbinoplasty Bilateral 12/19/2012    Procedure: BILATERAL NASAL SEPTOPLASTY WITH BIALTERAL TURBINATE REDUCTION;  Surgeon: Osborn Coho, MD;  Location: [facility];  Service: ENT;  Laterality: Bilateral;   No family history on file. History  Substance Use Topics  . Smoking status: Never Smoker   . Smokeless tobacco: Not on file  . Alcohol Use: Yes     Comment: socially    Review of Systems  Constitutional: Negative.  Negative for fever and chills.  Gastrointestinal: Positive for diarrhea. Negative for nausea, vomiting, abdominal pain, blood in stool and abdominal distention.    Allergies  Naproxen; Penicillins; and Sulfa antibiotics  Home Medications   Prior to Admission medications   Medication Sig Start Date End Date Taking? Authorizing Provider  dexlansoprazole (DEXILANT) 60 MG capsule Take 1 capsule (60 mg total) by mouth daily. 02/01/13  Yes Michele Mcalpine, MD  celecoxib (CELEBREX) 200 MG capsule Take 200 mg by mouth daily.    Historical Provider, MD  cephALEXin (KEFLEX) 500  MG capsule Take 1 capsule (500 mg total) by mouth 3 (three) times daily. 12/19/12   Osborn Coho, MD  cetirizine (ZYRTEC) 10 MG tablet Take 10 mg by mouth daily.    Historical Provider, MD  diphenhydrAMINE (BENADRYL) 25 MG tablet Take 2 tablets (50 mg total) by mouth every 8 (eight) hours as needed for itching. 07/14/11 08/13/11  Linwood Dibbles, MD  diphenoxylate-atropine (LOMOTIL) 2.5-0.025 MG per tablet Take 2 tablets by mouth 4 (four) times daily as needed for diarrhea or loose stools. 07/27/13   Linna Hoff, MD  famotidine (PEPCID) 20 MG tablet Take 1 tablet (20 mg total) by mouth 2 (two) times daily. 07/14/11 07/13/12  Linwood Dibbles, MD  HYDROcodone-acetaminophen (NORCO) 5-325 MG per tablet Take 1-2 tablets by mouth every 6 (six) hours as needed. 12/19/12   Osborn Coho, MD  ondansetron (ZOFRAN ODT) 4 MG disintegrating tablet Take 1 tablet (4 mg total) by mouth every 8 (eight) hours as needed for nausea or vomiting. 12/19/12   Osborn Coho, MD  ondansetron (ZOFRAN-ODT) 4 MG disintegrating tablet Take 1 tablet (4 mg total) by mouth every 8 (eight) hours as needed for nausea. 03/12/12   Barbaraann Share, MD   Temp(Src) 98.3 F (36.8 C) (Oral)  SpO2 100% Physical Exam  Nursing note and vitals reviewed. Constitutional: He is oriented to person, place, and time. He appears well-developed and well-nourished. No distress.  HENT:  Mouth/Throat: Oropharynx is clear and moist.  Neck: Normal range of motion. Neck supple.  Cardiovascular: Normal heart sounds.   Pulmonary/Chest: Breath sounds normal.  Abdominal: Soft. Bowel sounds are normal. He exhibits no distension and no mass. There is generalized tenderness. There is no rigidity, no rebound, no guarding and no CVA tenderness.  Lymphadenopathy:    He has no cervical adenopathy.  Neurological: He is alert and oriented to person, place, and time.  Skin: Skin is warm and dry.    ED Course  Procedures (including critical care time) Labs Review Labs  Reviewed - No data to display  Imaging Review No results found.   MDM   1. Diarrhea in adult patient        Linna HoffJames D Tenoch Mcclure, MD 07/27/13 [phone]

## 2013-08-30 ENCOUNTER — Other Ambulatory Visit: Payer: Self-pay | Admitting: Gastroenterology

## 2014-04-22 ENCOUNTER — Telehealth: Payer: Self-pay | Admitting: Pulmonary Disease

## 2014-04-22 NOTE — Telephone Encounter (Signed)
Complains of one week of sinus congestion, pressure, blowing purulent material from nose.  +cough with chest congestion and purulence as well.  No better with OTC preparations.  Low grade fever.  +purulence in nares Chest with upper airway wheeze, no lower airway wheeze.  +rattling congestion Cor with rrr  Impression:  Sinobronchitis  Plan:  Will send in prescription for zpak.

## 2015-03-01 IMAGING — CT CT MAXILLOFACIAL W/O CM
3 series · 16 of 47 positions shown, 19 images · non-contrast
Comparison: None.

CLINICAL DATA: Deviated septum

EXAM:
CT MAXILLOFACIAL WITHOUT CONTRAST
TECHNIQUE: Multidetector CT imaging of the maxillofacial structures was
performed. Multiplanar CT image reconstructions were also generated.
A small metallic BB was placed on the right temple in order to
reliably differentiate right from left.

[Series 2: facial bones · axial · 0.41mm/px · z∈[-18,+120]mm · 10 of 81 slices shown, 13 images]
[im 6/81  brain]
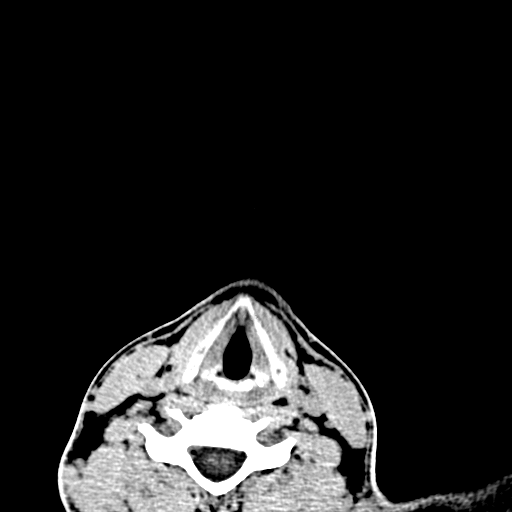
[im 6/81  bone]
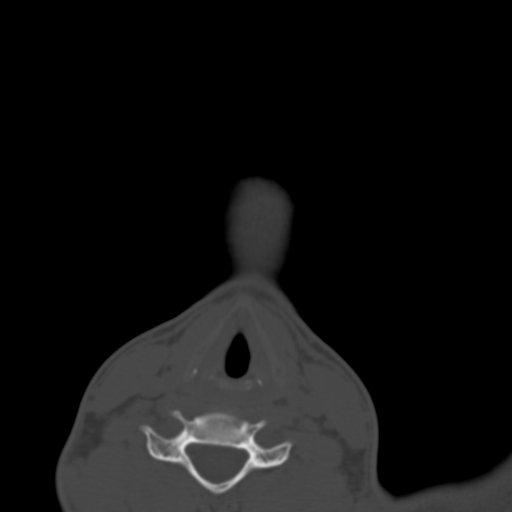
[im 14/81  bone]
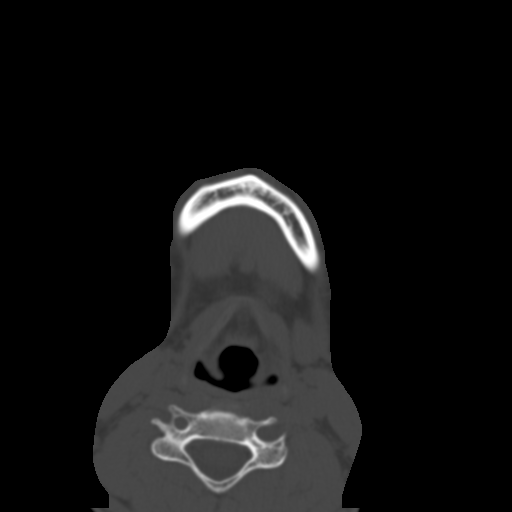
[im 23/81  bone]
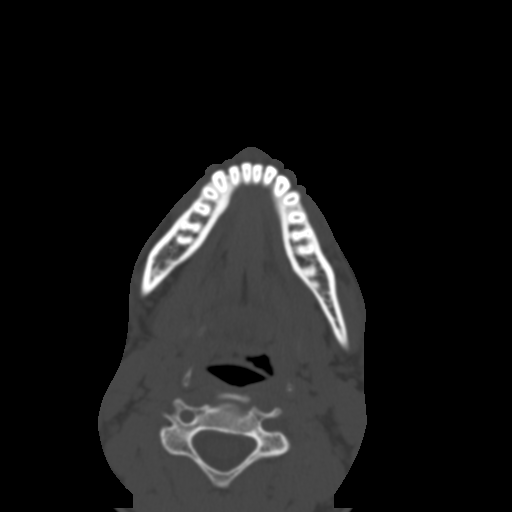
[im 28/81  bone]
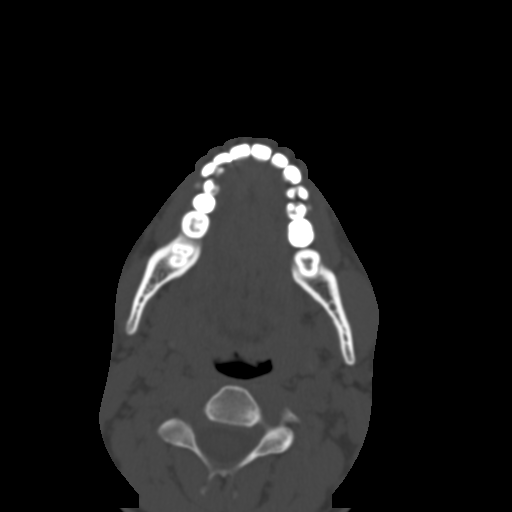
[im 36/81  brain]
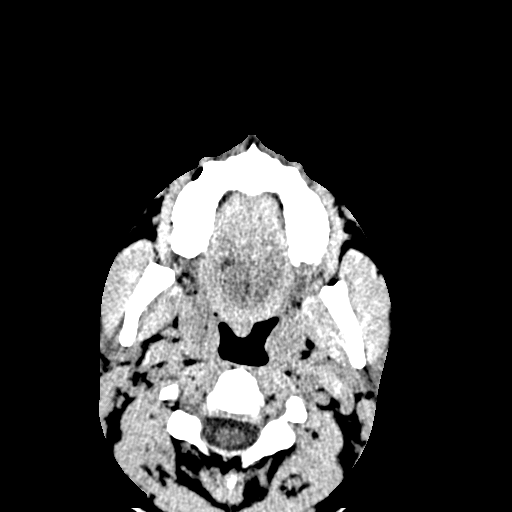
[im 36/81  bone]
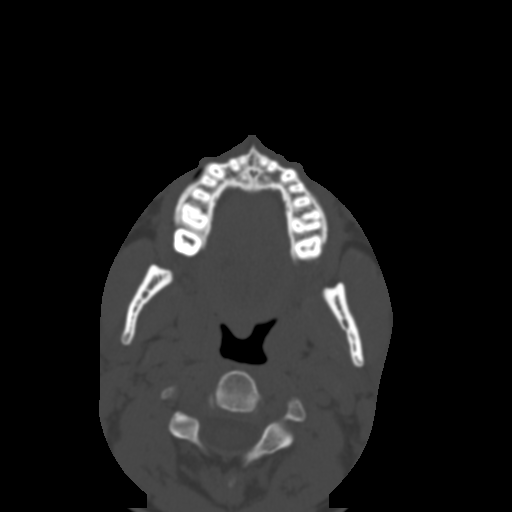
[im 45/81  bone]
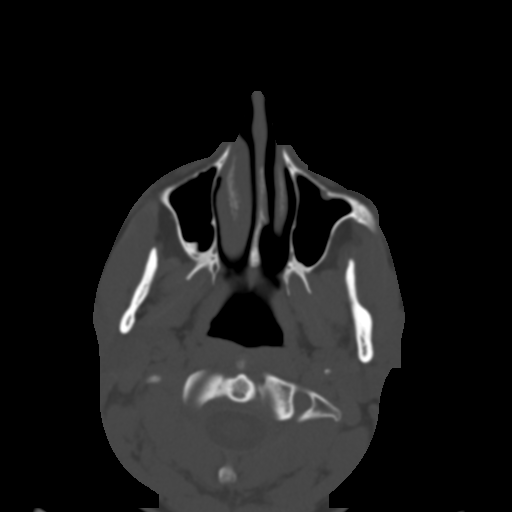
[im 53/81  bone]
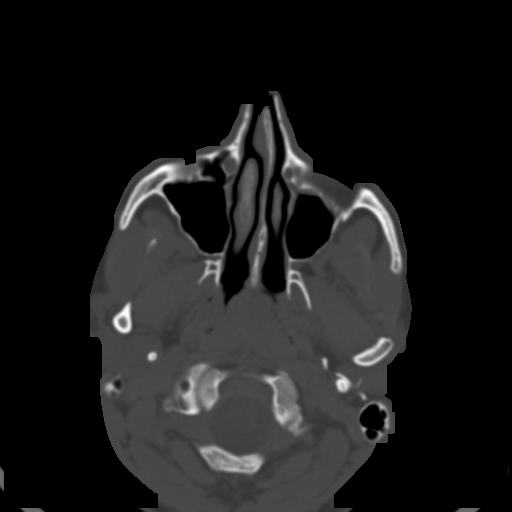
[im 61/81  bone]
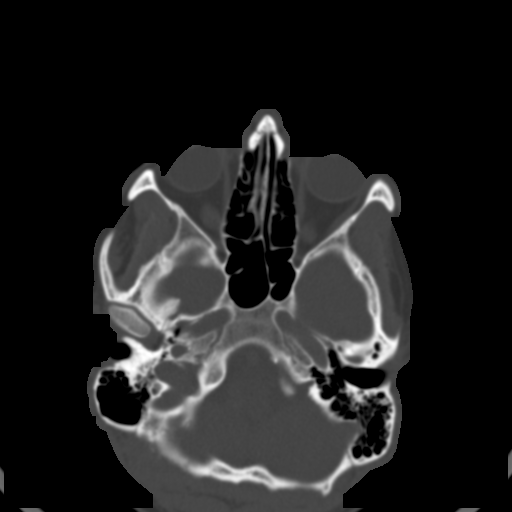
[im 67/81  brain]
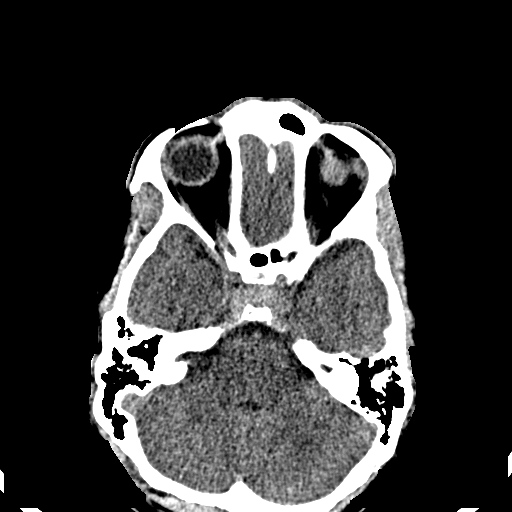
[im 67/81  bone]
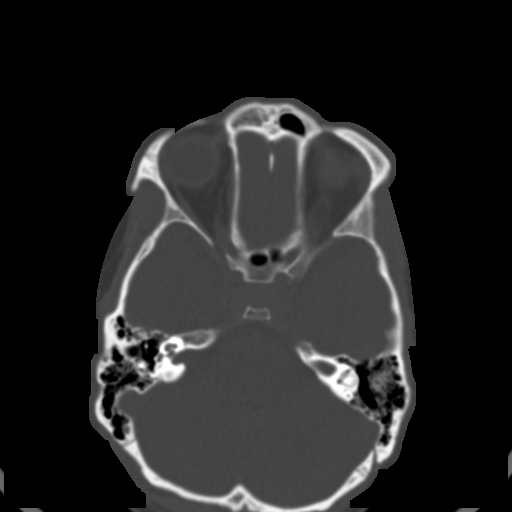
[im 75/81  bone]
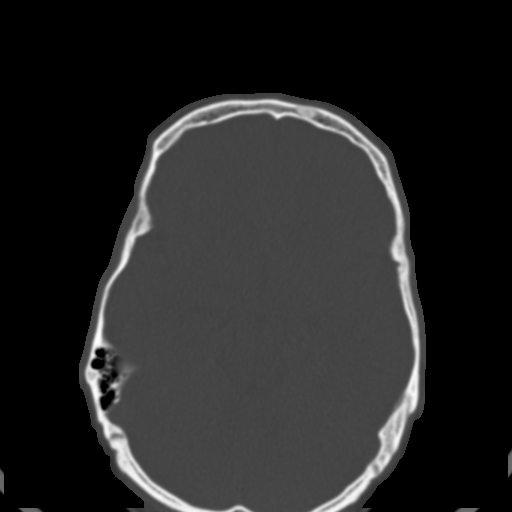

[Series 8044: cor · coronal · 0.41mm/px · 3 of 79 slices shown]
[im 27/79  bone]
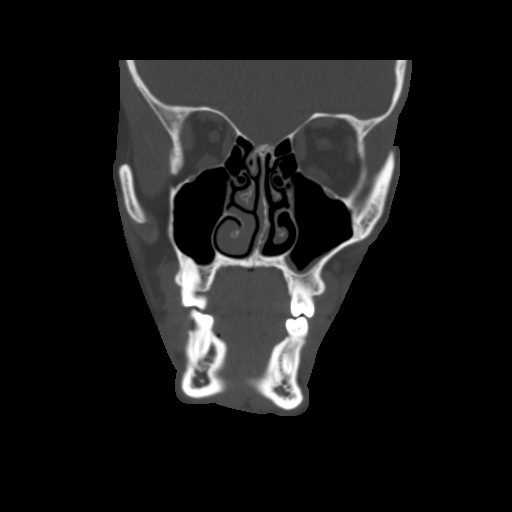
[im 35/79  bone]
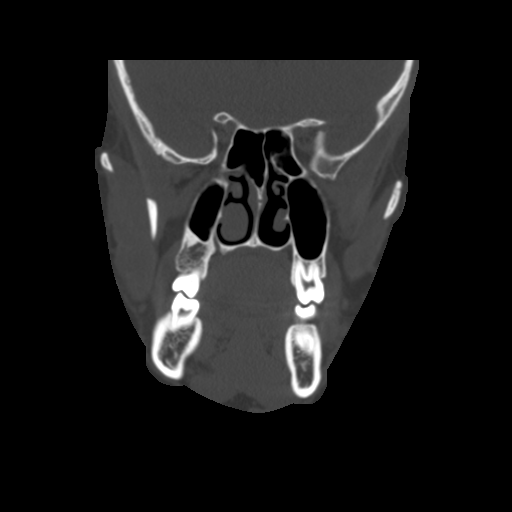
[im 44/79  bone]
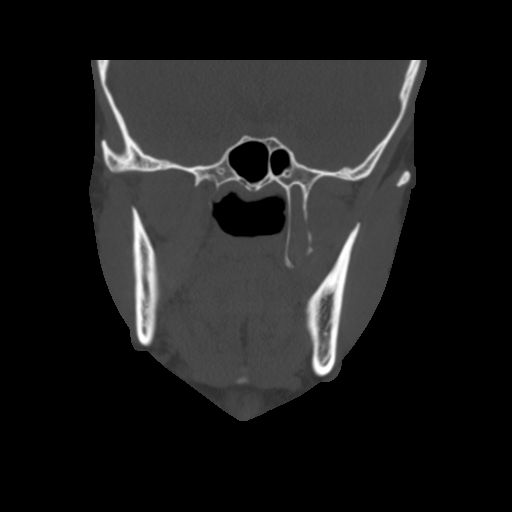

[Series 8045: sag · sagittal · 0.41mm/px · 3 of 77 slices shown]
[im 26/77  bone]
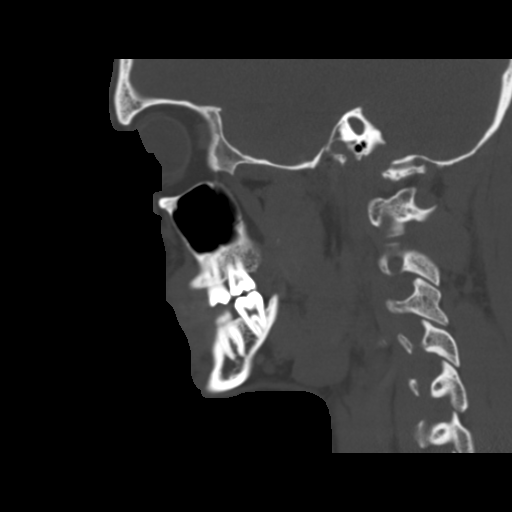
[im 39/77  bone]
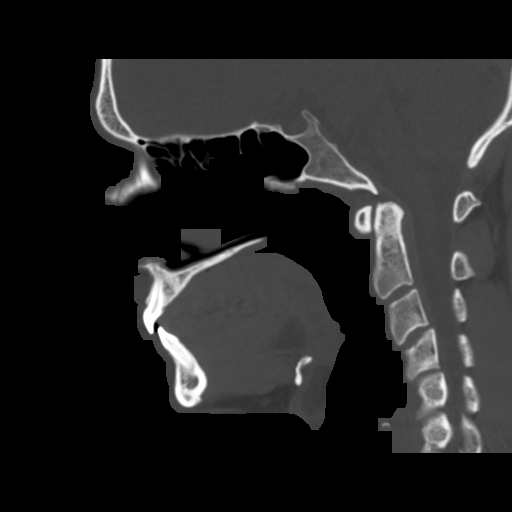
[im 51/77  bone]
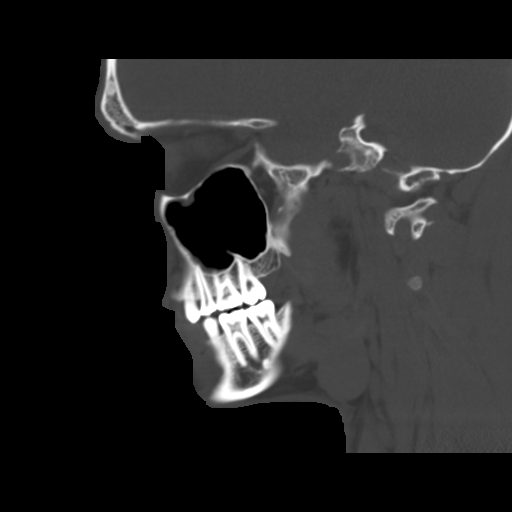

[16 of 47 positions shown; findings below may reference images not displayed]

FINDINGS: The scan does demonstrate deviation of the septum in the left for
direction extending from front to back. Extent measures 3-4 mm.
Nasal passages appear sufficiently patent at this moment. Frontal
sinuses are hypoplastic. The ethmoid sinuses are clear. The sphenoid
sinus is clear. The right maxillary sinus is clear. There is mucosal
thickening along the roof and anterior wall of the left maxillary
sinus. Some soft tissue thickening is present in the region of the
infundibulum on the left. No free fluid.

No other focal facial finding.
IMPRESSION: Nasal septal bowing towards the left measuring 3-4 mm.

Mild mucosal thickening of the left maxillary sinus including in the
region of the infundibulum. No free fluid.

## 2019-10-01 DIAGNOSIS — R112 Nausea with vomiting, unspecified: Secondary | ICD-10-CM | POA: Diagnosis not present

## 2019-10-01 DIAGNOSIS — J069 Acute upper respiratory infection, unspecified: Secondary | ICD-10-CM | POA: Diagnosis not present

## 2020-01-21 DIAGNOSIS — Z20822 Contact with and (suspected) exposure to covid-19: Secondary | ICD-10-CM | POA: Diagnosis not present

## 2020-07-20 DIAGNOSIS — R112 Nausea with vomiting, unspecified: Secondary | ICD-10-CM | POA: Diagnosis not present

## 2020-07-20 DIAGNOSIS — K2100 Gastro-esophageal reflux disease with esophagitis, without bleeding: Secondary | ICD-10-CM | POA: Diagnosis not present

## 2020-09-07 ENCOUNTER — Other Ambulatory Visit: Payer: Self-pay

## 2020-09-07 ENCOUNTER — Ambulatory Visit (HOSPITAL_COMMUNITY)
Admission: EM | Admit: 2020-09-07 | Discharge: 2020-09-07 | Disposition: A | Discharge status: Home / Self Care | Payer: BC Managed Care – PPO | Attending: Family Medicine | Admitting: Family Medicine

## 2020-09-07 ENCOUNTER — Encounter (HOSPITAL_COMMUNITY): Payer: Self-pay | Admitting: Emergency Medicine

## 2020-09-07 DIAGNOSIS — J4520 Mild intermittent asthma, uncomplicated: Secondary | ICD-10-CM | POA: Insufficient documentation

## 2020-09-07 DIAGNOSIS — R059 Cough, unspecified: Secondary | ICD-10-CM | POA: Insufficient documentation

## 2020-09-07 DIAGNOSIS — Z20822 Contact with and (suspected) exposure to covid-19: Secondary | ICD-10-CM | POA: Diagnosis not present

## 2020-09-07 DIAGNOSIS — J069 Acute upper respiratory infection, unspecified: Secondary | ICD-10-CM | POA: Insufficient documentation

## 2020-09-07 LAB — SARS CORONAVIRUS 2 (TAT 6-24 HRS): SARS Coronavirus 2: NEGATIVE

## 2020-09-07 MED ORDER — PROMETHAZINE-DM 6.25-15 MG/5ML PO SYRP: 5.0000 mL | ORAL_SOLUTION | Freq: Four times a day (QID) | ORAL | 0 refills | Status: DC | PRN

## 2020-09-07 MED ORDER — ALBUTEROL SULFATE HFA 108 (90 BASE) MCG/ACT IN AERS: 1.0000 | INHALATION_SPRAY | Freq: Four times a day (QID) | RESPIRATORY_TRACT | 0 refills | Status: DC | PRN

## 2020-09-07 NOTE — ED Provider Notes (Signed)
[facility]    CSN: [id number] Arrival date & time: 09/07/20  1036      History   Chief Complaint Chief Complaint  Patient presents with   Cough    HPI Roberto Thomas is a 30 y.o. male.   Patient presenting today with 3-day history of hacking cough, mild chest tightness during coughing spells, runny nose, headache, fatigue, chills, sweats.  Denies chest pain, shortness of breath, significant wheezing, abdominal pain, nausea vomiting or diarrhea.  So far has been taking DayQuil and NyQuil with mild temporary relief.  Does have a history of reactive airway disease without any inhalers at this time.  No known sick contacts but has been traveling and just got back yesterday from [place].   Past Medical History:  Diagnosis Date   Deviated septum    GERD (gastroesophageal reflux disease)     Patient Active Problem List   Diagnosis Date Noted   Nasal septal deviation 12/19/2012   REACTIVE AIRWAY DISEASE 01/08/2010   COUGH 01/08/2010   ACID REFLUX DISEASE 10/20/2008   ADVERSE DRUG REACTION 10/20/2008    Past Surgical History:  Procedure Laterality Date   FRACTURE SURGERY Right    NASAL SEPTOPLASTY W/ TURBINOPLASTY Bilateral 12/19/2012   Procedure: BILATERAL NASAL SEPTOPLASTY WITH BIALTERAL TURBINATE REDUCTION;  Surgeon: Osborn Coho, MD;  Location: [facility];  Service: ENT;  Laterality: Bilateral;       Home Medications    Prior to Admission medications   Medication Sig Start Date End Date Taking? Authorizing Provider  albuterol (VENTOLIN HFA) 108 (90 Base) MCG/ACT inhaler Inhale 1-2 puffs into the lungs every 6 (six) hours as needed for wheezing or shortness of breath. 09/07/20  Yes Particia Nearing, PA-C  cetirizine (ZYRTEC) 10 MG tablet Take 10 mg by mouth daily.   Yes [provider]  promethazine-dextromethorphan (PROMETHAZINE-DM) 6.25-15 MG/5ML syrup Take 5 mLs by mouth 4 (four) times daily as needed for cough. 09/07/20  Yes  Particia Nearing, PA-C  RABEprazole (ACIPHEX) 20 MG tablet Take 20 mg by mouth daily.   Yes [provider]  celecoxib (CELEBREX) 200 MG capsule Take 200 mg by mouth daily. Patient not taking: Reported on 09/07/2020    [provider]  cephALEXin (KEFLEX) 500 MG capsule Take 1 capsule (500 mg total) by mouth 3 (three) times daily. Patient not taking: Reported on 09/07/2020 12/19/12   Osborn Coho, MD  dexlansoprazole (DEXILANT) 60 MG capsule Take 1 capsule (60 mg total) by mouth daily. Patient not taking: Reported on 09/07/2020 02/01/13   Michele Mcalpine, MD  diphenhydrAMINE (BENADRYL) 25 MG tablet Take 2 tablets (50 mg total) by mouth every 8 (eight) hours as needed for itching. Patient not taking: Reported on 09/07/2020 07/14/11 08/13/11  Linwood Dibbles, MD  diphenoxylate-atropine (LOMOTIL) 2.5-0.025 MG per tablet Take 2 tablets by mouth 4 (four) times daily as needed for diarrhea or loose stools. Patient not taking: Reported on 09/07/2020 07/27/13   Linna Hoff, MD  famotidine (PEPCID) 20 MG tablet Take 1 tablet (20 mg total) by mouth 2 (two) times daily. Patient not taking: Reported on 09/07/2020 07/14/11 07/13/12  Linwood Dibbles, MD  HYDROcodone-acetaminophen [facility]) 5-325 MG per tablet Take 1-2 tablets by mouth every 6 (six) hours as needed. Patient not taking: Reported on 09/07/2020 12/19/12   Osborn Coho, MD  ondansetron (ZOFRAN ODT) 4 MG disintegrating tablet Take 1 tablet (4 mg total) by mouth every 8 (eight) hours as needed for nausea or vomiting.  Patient not taking: Reported on 09/07/2020 12/19/12   Osborn Coho, MD  ondansetron (ZOFRAN-ODT) 4 MG disintegrating tablet Take 1 tablet (4 mg total) by mouth every 8 (eight) hours as needed for nausea. Patient not taking: Reported on 09/07/2020 03/12/12   Wygant, Maree Krabbe, MD    Family History Family History  Problem Relation Age of Onset   Hyperlipidemia Father    Hypertension Father     Social History Social History    Tobacco Use   Smoking status: Some Days    Types: Cigarettes   Smokeless tobacco: Never  Vaping Use   Vaping Use: Some days  Substance Use Topics   Alcohol use: Yes    Comment: socially   Drug use: Yes    Types: Marijuana     Allergies   Naproxen, Penicillins, and Sulfa antibiotics   Review of Systems Review of Systems Per HPI  Physical Exam Triage Vital Signs ED Triage Vitals  Enc Vitals Group     BP --      Pulse Rate 09/07/20 1253 79     Resp 09/07/20 1253 18     Temp 09/07/20 1253 99 F (37.2 C)     Temp Source 09/07/20 1253 Oral     SpO2 09/07/20 1253 97 %     Weight --      Height --      Head Circumference --      Peak Flow --      Pain Score 09/07/20 1247 5     Pain Loc --      Pain Edu? --      Excl. in GC? --    No data found.  Updated Vital Signs Pulse 79   Temp 99 F (37.2 C) (Oral)   Resp 18   SpO2 97%   Visual Acuity Right Eye Distance:   Left Eye Distance:   Bilateral Distance:    Right Eye Near:   Left Eye Near:    Bilateral Near:     Physical Exam Vitals and nursing note reviewed.  Constitutional:      Appearance: Normal appearance.  HENT:     Head: Atraumatic.     Nose: Rhinorrhea present.     Mouth/Throat:     Mouth: Mucous membranes are moist.     Pharynx: Posterior oropharyngeal erythema present.  Eyes:     Extraocular Movements: Extraocular movements intact.     Conjunctiva/sclera: Conjunctivae normal.  Cardiovascular:     Rate and Rhythm: Normal rate and regular rhythm.     Heart sounds: Normal heart sounds.  Pulmonary:     Effort: Pulmonary effort is normal. No respiratory distress.     Breath sounds: Normal breath sounds. No wheezing or rales.  Abdominal:     General: Bowel sounds are normal. There is no distension.     Palpations: Abdomen is soft.     Tenderness: There is no abdominal tenderness. There is no guarding.  Musculoskeletal:        General: Normal range of motion.     Cervical back: Normal  range of motion and neck supple.  Skin:    General: Skin is warm and dry.  Neurological:     General: No focal deficit present.     Mental Status: He is oriented to person, place, and time.     Motor: No weakness.     Gait: Gait normal.  Psychiatric:        Mood and Affect: Mood normal.  Thought Content: Thought content normal.        Judgment: Judgment normal.   UC Treatments / Results  Labs (all labs ordered are listed, but only abnormal results are displayed) Labs Reviewed  SARS CORONAVIRUS 2 (TAT 6-24 HRS)    EKG   Radiology No results found.  Procedures Procedures (including critical care time)  Medications Ordered in UC Medications - No data to display  Initial Impression / Assessment and Plan / UC Course  I have reviewed the triage vital signs and the nursing notes.  Pertinent labs & imaging results that were available during my care of the patient were reviewed by me and considered in my medical decision making (see chart for details).     COVID PCR pending, vitals and exam reassuring today.  We will treat with albuterol inhaler, Phenergan DM, over-the-counter supportive medications at home care.  Return for acutely worsening symptoms.  Quarantine protocol reviewed, work note given.  Final Clinical Impressions(s) / UC Diagnoses   Final diagnoses:  Viral URI with cough  Cough  Mild intermittent reactive airway disease without complication   Discharge Instructions   None    ED Prescriptions     Medication Sig Dispense Auth. Provider   albuterol (VENTOLIN HFA) 108 (90 Base) MCG/ACT inhaler Inhale 1-2 puffs into the lungs every 6 (six) hours as needed for wheezing or shortness of breath. 18 g Roosvelt Maser [address], [place]   promethazine-dextromethorphan (PROMETHAZINE-DM) 6.25-15 MG/5ML syrup Take 5 mLs by mouth 4 (four) times daily as needed for cough. 100 mL Particia Nearing, [place]      PDMP not reviewed this encounter.   Particia Nearing, [place] 09/07/20 1402

## 2020-09-07 NOTE — ED Triage Notes (Addendum)
Coughing since Saturday night, worsened yesterday and last night.  Patient has runny nose, pounding headache and fatigue, congestion, and hot and cold episodes.  Just returned home from [employer]

## 2020-09-09 ENCOUNTER — Ambulatory Visit
Admission: EM | Admit: 2020-09-09 | Discharge: 2020-09-09 | Disposition: A | Discharge status: Home / Self Care | Payer: BC Managed Care – PPO | Attending: Student | Admitting: Student

## 2020-09-09 ENCOUNTER — Other Ambulatory Visit: Payer: Self-pay

## 2020-09-09 DIAGNOSIS — J208 Acute bronchitis due to other specified organisms: Secondary | ICD-10-CM | POA: Diagnosis not present

## 2020-09-09 MED ORDER — PREDNISONE 10 MG (21) PO TBPK: ORAL_TABLET | Freq: Every day | ORAL | 0 refills | Status: DC

## 2020-09-09 NOTE — Discharge Instructions (Addendum)
-  Prednisone taper for cough/bronchitis. I recommend taking this in the morning as it could give you energy.  Avoid NSAIDs like ibuprofen and alleve while taking this medication as they can increase your risk of stomach upset and even GI bleeding when in combination with a steroid. You can continue tylenol (acetaminophen) up to 1000mg  3x daily. -Continue Albuterol inhaler as needed for cough, wheezing, shortness of breath, 1 to 2 puffs every 6 hours as needed. -Continue Promethazine DM cough syrup -If symptoms are getting worse instead of better, like new fever/chills, worsening shortness of breath, etc.-seek additional immediate medical attention

## 2020-09-09 NOTE — ED Provider Notes (Signed)
[facility]    CSN: [id number] Arrival date & time: 09/09/20  1150      History   Chief Complaint Chief Complaint  Patient presents with   Cough   Fever    HPI Roberto Thomas is a 30 y.o. male presenting with continued viral symptoms- and worsening cough.  This patient was last seen at our urgent care on 9/5. Today notes subjective chills (normal temperature at home), fatigue, right ear pressure, without hearing changes dizziness or tinnitus. Cough is hacking and productive of yellow mucous. This patient does have a history of reactive airway disease, albuterol inhaler and Promethazine DM were sent at last visit.  COVID PCR was negative on 9/5.  HPI  Past Medical History:  Diagnosis Date   Deviated septum    GERD (gastroesophageal reflux disease)     Patient Active Problem List   Diagnosis Date Noted   Nasal septal deviation 12/19/2012   REACTIVE AIRWAY DISEASE 01/08/2010   COUGH 01/08/2010   ACID REFLUX DISEASE 10/20/2008   ADVERSE DRUG REACTION 10/20/2008    Past Surgical History:  Procedure Laterality Date   FRACTURE SURGERY Right    NASAL SEPTOPLASTY W/ TURBINOPLASTY Bilateral 12/19/2012   Procedure: BILATERAL NASAL SEPTOPLASTY WITH BIALTERAL TURBINATE REDUCTION;  Surgeon: Osborn Coho, MD;  Location: [facility];  Service: ENT;  Laterality: Bilateral;       Home Medications    Prior to Admission medications   Medication Sig Start Date End Date Taking? Authorizing Provider  predniSONE (STERAPRED UNI-PAK 21 TAB) 10 MG (21) TBPK tablet Take by mouth daily. Take 6 tabs by mouth daily  for 2 days, then 5 tabs for 2 days, then 4 tabs for 2 days, then 3 tabs for 2 days, 2 tabs for 2 days, then 1 tab by mouth daily for 2 days 09/09/20  Yes Cheree Ditto, Lyman Speller, PA-C  albuterol (VENTOLIN HFA) 108 (90 Base) MCG/ACT inhaler Inhale 1-2 puffs into the lungs every 6 (six) hours as needed for wheezing or shortness of breath. 09/07/20   Particia Nearing,  PA-C  cetirizine (ZYRTEC) 10 MG tablet Take 10 mg by mouth daily.    [provider]  promethazine-dextromethorphan (PROMETHAZINE-DM) 6.25-15 MG/5ML syrup Take 5 mLs by mouth 4 (four) times daily as needed for cough. 09/07/20   Particia Nearing, PA-C  RABEprazole (ACIPHEX) 20 MG tablet Take 20 mg by mouth daily.    [provider]    Family History Family History  Problem Relation Age of Onset   Hyperlipidemia Father    Hypertension Father     Social History Social History   Tobacco Use   Smoking status: Some Days    Types: Cigarettes   Smokeless tobacco: Never  Vaping Use   Vaping Use: Some days  Substance Use Topics   Alcohol use: Yes    Comment: socially   Drug use: Yes    Types: Marijuana     Allergies   Naproxen, Penicillins, and Sulfa antibiotics   Review of Systems Review of Systems  Constitutional:  Negative for appetite change, chills and fever.  HENT:  Positive for congestion. Negative for ear pain, rhinorrhea, sinus pressure, sinus pain and sore throat.   Eyes:  Negative for redness and visual disturbance.  Respiratory:  Positive for cough. Negative for chest tightness, shortness of breath and wheezing.   Cardiovascular:  Negative for chest pain and palpitations.  Gastrointestinal:  Negative for abdominal pain, constipation, diarrhea, nausea and vomiting.  Genitourinary:  Negative for dysuria, frequency and urgency.  Musculoskeletal:  Negative for myalgias.  Neurological:  Negative for dizziness, weakness and headaches.  Psychiatric/Behavioral:  Negative for confusion.   All other systems reviewed and are negative.   Physical Exam Triage Vital Signs ED Triage Vitals  Enc Vitals Group     BP 09/09/20 1300 121/76     Pulse Rate 09/09/20 1300 65     Resp 09/09/20 1300 20     Temp 09/09/20 1300 98.3 F (36.8 C)     Temp Source 09/09/20 1300 Oral     SpO2 09/09/20 1300 98 %     Weight --      Height --      Head Circumference  --      Peak Flow --      Pain Score 09/09/20 1258 4     Pain Loc --      Pain Edu? --      Excl. in GC? --    No data found.  Updated Vital Signs BP 121/76 (BP Location: Right Arm)   Pulse 65   Temp 98.3 F (36.8 C) (Oral)   Resp 20   SpO2 98%   Visual Acuity Right Eye Distance:   Left Eye Distance:   Bilateral Distance:    Right Eye Near:   Left Eye Near:    Bilateral Near:     Physical Exam Vitals reviewed.  Constitutional:      General: He is not in acute distress.    Appearance: Normal appearance. He is not ill-appearing.  HENT:     Head: Normocephalic and atraumatic.     Right Ear: Tympanic membrane, ear canal and external ear normal. No tenderness. No middle ear effusion. There is no impacted cerumen. Tympanic membrane is not perforated, erythematous, retracted or bulging.     Left Ear: Tympanic membrane, ear canal and external ear normal. No tenderness.  No middle ear effusion. There is no impacted cerumen. Tympanic membrane is not perforated, erythematous, retracted or bulging.     Ears:     Comments: No infection     Nose: Nose normal. No congestion.     Mouth/Throat:     Mouth: Mucous membranes are moist.     Pharynx: Uvula midline. No oropharyngeal exudate or posterior oropharyngeal erythema.  Eyes:     Extraocular Movements: Extraocular movements intact.     Pupils: Pupils are equal, round, and reactive to light.  Cardiovascular:     Rate and Rhythm: Normal rate and regular rhythm.     Heart sounds: Normal heart sounds.  Pulmonary:     Effort: Pulmonary effort is normal.     Breath sounds: Wheezing present. No decreased breath sounds, rhonchi or rales.     Comments: Wheezes throughout  Abdominal:     Palpations: Abdomen is soft.     Tenderness: There is no abdominal tenderness. There is no guarding or rebound.  Neurological:     General: No focal deficit present.     Mental Status: He is alert and oriented to person, place, and time.  Psychiatric:         Mood and Affect: Mood normal.        Behavior: Behavior normal.        Thought Content: Thought content normal.        Judgment: Judgment normal.     UC Treatments / Results  Labs (all labs ordered are listed, but only abnormal results are displayed) Labs  Reviewed - No data to display   EKG   Radiology No results found.  Procedures Procedures (including critical care time)  Medications Ordered in UC Medications - No data to display  Initial Impression / Assessment and Plan / UC Course  I have reviewed the triage vital signs and the nursing notes.  Pertinent labs & imaging results that were available during my care of the patient were reviewed by me and considered in my medical decision making (see chart for details).     This patient is a very pleasant 30 y.o. year old male presenting with viral bronchitis. History reactive airway disease. Today this pt is afebrile nontachycardic nontachypneic, oxygenating well on room air, scattered wheezes but no rhonchi or rales.    COVID PCR was negative on 9/5, declines additional testing today. Low clinical suspicion for bacterial pneumonia. Patient is in agreement that CXR is not necessary at this time.   Continue promethazine DM, albuterol. Sent prednisone taper.  Strict ED return precautions discussed. Patient verbalizes understanding and agreement.   Coding Level 4 for acute illness with systemic symptoms, and prescription drug management   Final Clinical Impressions(s) / UC Diagnoses   Final diagnoses:  Viral bronchitis     Discharge Instructions      -Prednisone taper for cough/bronchitis. I recommend taking this in the morning as it could give you energy.  Avoid NSAIDs like ibuprofen and alleve while taking this medication as they can increase your risk of stomach upset and even GI bleeding when in combination with a steroid. You can continue tylenol (acetaminophen) up to 1000mg  3x daily. -Continue Albuterol  inhaler as needed for cough, wheezing, shortness of breath, 1 to 2 puffs every 6 hours as needed. -Continue Promethazine DM cough syrup -If symptoms are getting worse instead of better, like new fever/chills, worsening shortness of breath, etc.-seek additional immediate medical attention      ED Prescriptions     Medication Sig Dispense Auth. Provider   predniSONE (STERAPRED UNI-PAK 21 TAB) 10 MG (21) TBPK tablet Take by mouth daily. Take 6 tabs by mouth daily  for 2 days, then 5 tabs for 2 days, then 4 tabs for 2 days, then 3 tabs for 2 days, 2 tabs for 2 days, then 1 tab by mouth daily for 2 days 42 tablet , PA-C      PDMP not reviewed this encounter.   Rhys Martini, PA-C 09/09/20 1317

## 2020-09-09 NOTE — ED Triage Notes (Addendum)
Pt states he was seen on Monday for fatigue, fever and congestion. Pt states no changes since then, and is having some slight right ear pain (states he feels fluid moving in ear).  Started Last Saturday

## 2020-12-01 ENCOUNTER — Ambulatory Visit
Admission: EM | Admit: 2020-12-01 | Discharge: 2020-12-01 | Disposition: A | Discharge status: Home / Self Care | Payer: BC Managed Care – PPO | Attending: Emergency Medicine | Admitting: Emergency Medicine

## 2020-12-01 ENCOUNTER — Other Ambulatory Visit: Payer: Self-pay

## 2020-12-01 DIAGNOSIS — J069 Acute upper respiratory infection, unspecified: Secondary | ICD-10-CM

## 2020-12-01 DIAGNOSIS — J029 Acute pharyngitis, unspecified: Secondary | ICD-10-CM

## 2020-12-01 DIAGNOSIS — R051 Acute cough: Secondary | ICD-10-CM | POA: Diagnosis not present

## 2020-12-01 LAB — POCT INFLUENZA A/B
Influenza A, POC: NEGATIVE
Influenza B, POC: NEGATIVE

## 2020-12-01 LAB — POCT RAPID STREP A (OFFICE): Rapid Strep A Screen: NEGATIVE

## 2020-12-01 NOTE — Discharge Instructions (Addendum)
Your symptoms are most consistent with a viral upper respiratory illness.  Rapid influenza testing today was negative for influenza.    The results of your respiratory COVID-19 testing will be posted to your MyChart.  If any of the result is positive, you will be contacted by phone and further recommendations, if needed, will be provided to you.  Please remain home from work, school, public places until you have been fever free for 24 hours without the use of antifever medications such as Tylenol or ibuprofen.  Conservative care is recommended at this time.  This includes rest, pushing clear fluids and activity as tolerated.  You may also noticed that your appetite is reduced, this is okay as long as they are drinking plenty of clear fluids.  Acetaminophen (Tylenol): This is a good fever reducer.  If there body temperature rises above 101.5 as measured with a thermometer, it is recommended that you give them 1,000 mg every 6-8 hours until they are temperature falls below 101.5, please not take more than 3,000 mg of acetaminophen either as a separate medication or as in ingredient in an over-the-counter cold/flu preparation within a 24-hour period  Ibuprofen  (Advil, Motrin): This is a good anti-inflammatory medication which addresses aches and pains and, to some degree, congestion in the nasal passages.  I recommend giving between 400 to 600 mg every 6-8 hours as needed.  Pseudoephedrine (Sudafed): This is a decongestant.  This medication has to be purchased from the pharmacist counter, I recommend giving 2 tablets, 60 mg, 2-3 times a day as needed to relieve runny nose and sinus drainage.  Guaifenesin (Robitussin, Mucinex): This is an expectorant.  This helps break up chest congestion and loosen up thick nasal drainage making phlegm and drainage more liquid and therefore easier to remove.  I recommend being 400 mg three times daily as needed.  Dextromethorphan (any cough medicine with the letters  "DM" added to it's name such as Robitussin DM): This is a cough suppressant.  This is often recommended to be taken at nighttime to suppress cough and help children sleep.  Give dosage as directed on the bottle.   Chloraseptic Throat Spray: Spray 5 sprays into affected area every 2 hours, hold for 15 seconds and either swallow or spit it out.  This is a excellent numbing medication because it is a spray, you can put it right where you needed and so sucking on a lozenge and numbing your entire mouth.  Based on my physical exam findings and the history provided  today, I do not see any evidence of bacterial infection therefore treatment with antibiotics would be of no benefit.  Please follow-up within the next 3 to 5 days either with your primary care provider or urgent care if your symptoms do not resolve.  If you do not have a primary care provider, we will assist you in finding one.

## 2020-12-01 NOTE — ED Triage Notes (Signed)
Pt presents with c/o sore throat and cough that began yesterday, hurts worse on right side pt reports h/o tonsil stones

## 2020-12-01 NOTE — ED Provider Notes (Signed)
[facility]    CSN: [id number] Arrival date & time: 12/01/20  9163    HISTORY   Chief Complaint  Patient presents with   Sore Throat   Cough   HPI Roberto Thomas is a 30 y.o. male. Pt presents with c/o sore throat and cough that began yesterday, hurts worse on right side pt reports h/o tonsil stones.  Patient is afebrile on arrival, rapid strep test performed prior to provider evaluation was negative.  The history is provided by the patient.  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 Authorizing Provider  albuterol (VENTOLIN HFA) 108 (90 Base) MCG/ACT inhaler Inhale 1-2 puffs into the lungs every 6 (six) hours as needed for wheezing or shortness of breath. 09/07/20   Particia Nearing, PA-C  cetirizine (ZYRTEC) 10 MG tablet Take 10 mg by mouth daily.    [provider]  predniSONE (STERAPRED UNI-PAK 21 TAB) 10 MG (21) TBPK tablet Take by mouth daily. Take 6 tabs by mouth daily  for 2 days, then 5 tabs for 2 days, then 4 tabs for 2 days, then 3 tabs for 2 days, 2 tabs for 2 days, then 1 tab by mouth daily for 2 days 09/09/20   Rhys Martini, PA-C  promethazine-dextromethorphan (PROMETHAZINE-DM) 6.25-15 MG/5ML syrup Take 5 mLs by mouth 4 (four) times daily as needed  for cough. 09/07/20   Particia Nearing, PA-C  RABEprazole (ACIPHEX) 20 MG tablet Take 20 mg by mouth daily.    [provider]   Family History Family History  Problem Relation Age of Onset   Hyperlipidemia Father    Hypertension Father    Social History Social History   Tobacco Use   Smoking status: Some Days    Types: Cigarettes   Smokeless tobacco: Never  Vaping Use   Vaping Use: Some days  Substance Use Topics   Alcohol use: Yes    Comment: socially   Drug use: Yes    Types: Marijuana   Allergies   Naproxen, Penicillins, and Sulfa antibiotics  Review of Systems Review of Systems  Respiratory:  Positive for cough.   Pertinent findings noted in history of present illness.   Physical Exam Triage Vital Signs ED Triage Vitals  Enc Vitals Group     BP 10/30/20 0827 (!) 147/82     Pulse Rate 10/30/20 0827 72     Resp 10/30/20 0827 18     Temp 10/30/20 0827 98.3 F (36.8 C)     Temp Source 10/30/20 0827 Oral     SpO2 10/30/20 0827 98 %     Weight --      Height --      Head Circumference --      Peak Flow --  Pain Score 10/30/20 0826 5     Pain Loc --      Pain Edu? --      Excl. in GC? --   No data found.  Updated Vital Signs BP 126/86   Pulse 72   Temp 98.9 F (37.2 C)   Resp 18   SpO2 96%   Physical Exam Vitals and nursing note reviewed.  Constitutional:      General: He is not in acute distress.    Appearance: He is ill-appearing.  HENT:     Head: Normocephalic and atraumatic.     Salivary Glands: Right salivary gland is not diffusely enlarged or tender. Left salivary gland is not diffusely enlarged or tender.     Right Ear: Tympanic membrane, ear canal and external ear normal. No drainage. No middle ear effusion. There is no impacted cerumen. Tympanic membrane is not erythematous or bulging.     Left Ear: Tympanic membrane, ear canal and external ear normal. No drainage.  No middle ear effusion. There is no impacted cerumen.  Tympanic membrane is not erythematous or bulging.     Nose: Mucosal edema, congestion and rhinorrhea present. No nasal deformity or septal deviation. Rhinorrhea is clear.     Right Turbinates: Not enlarged, swollen or pale.     Left Turbinates: Not enlarged, swollen or pale.     Right Sinus: No maxillary sinus tenderness or frontal sinus tenderness.     Left Sinus: No maxillary sinus tenderness or frontal sinus tenderness.     Mouth/Throat:     Lips: Pink. No lesions.     Mouth: Mucous membranes are moist. No oral lesions.     Pharynx: Uvula midline. Pharyngeal swelling, posterior oropharyngeal erythema and uvula swelling present.     Tonsils: No tonsillar exudate. 0 on the right. 0 on the left.  Eyes:     General: Lids are normal.        Right eye: No discharge.        Left eye: No discharge.     Extraocular Movements: Extraocular movements intact.     Conjunctiva/sclera: Conjunctivae normal.     Right eye: Right conjunctiva is not injected.     Left eye: Left conjunctiva is not injected.  Neck:     Trachea: Trachea and phonation normal.  Cardiovascular:     Rate and Rhythm: Normal rate and regular rhythm.     Pulses: Normal pulses.     Heart sounds: Normal heart sounds. No murmur heard.   No friction rub. No gallop.  Pulmonary:     Effort: Pulmonary effort is normal. No accessory muscle usage, prolonged expiration or respiratory distress.     Breath sounds: Normal breath sounds. No stridor, decreased air movement or transmitted upper airway sounds. No decreased breath sounds, wheezing, rhonchi or rales.  Chest:     Chest wall: No tenderness.  Musculoskeletal:        General: Normal range of motion.     Cervical back: Normal range of motion and neck supple. Normal range of motion.  Lymphadenopathy:     Cervical: Cervical adenopathy present.     Right cervical: Posterior cervical adenopathy present.     Left cervical: Posterior cervical adenopathy present.  Skin:    General:  Skin is warm and dry.     Findings: No erythema or rash.  Neurological:     General: No focal deficit present.     Mental Status: He is alert and oriented to person,  place, and time.  Psychiatric:        Mood and Affect: Mood normal.        Behavior: Behavior normal.    Visual Acuity Right Eye Distance:   Left Eye Distance:   Bilateral Distance:    Right Eye Near:   Left Eye Near:    Bilateral Near:     UC Couse / Diagnostics / Procedures:    EKG  Radiology No results found.  Procedures Procedures (including critical care time)  UC Diagnoses / Final Clinical Impressions(s)   I have reviewed the triage vital signs and the nursing notes.  Pertinent labs & imaging results that were available during my care of the patient were reviewed by me and considered in my medical decision making (see chart for details).   Final diagnoses:  Acute pharyngitis, unspecified etiology  Acute cough  Viral upper respiratory illness   Rapid strep and rapid test today were negative.  Patient request COVID testing.  Patient advised the results of the test through his MyChart and, if positive, he will be contacted by phone.  Conservative care is recommended.  Note provided for work.  Disposition Upon Discharge:  Condition: stable for discharge home Home: take medications as prescribed; routine discharge instructions as discussed; follow up as advised.  Patient presented with an acute illness with associated systemic symptoms and significant discomfort requiring urgent management. In my opinion, this is a condition that a prudent lay person (someone who possesses an average knowledge of health and medicine) may potentially expect to result in complications if not addressed urgently such as respiratory distress, impairment of bodily function or dysfunction of bodily organs.   Routine symptom specific, illness specific and/or disease specific instructions were discussed with the patient and/or  caregiver at length.   As such, the patient has been evaluated and assessed, work-up was performed and treatment was provided in alignment with urgent care protocols and evidence based medicine.  Patient/parent/caregiver has been advised that the patient may require follow up for further testing and treatment if the symptoms continue in spite of treatment, as clinically indicated and appropriate.  The patient was tested for COVID-19, Influenza and/or RSV, then the patient/parent/guardian was advised to isolate at home pending the results of his/her diagnostic coronavirus test and potentially longer if they're positive. I have also advised pt that if his/her COVID-19 test returns positive, it's recommended to self-isolate for at least 10 days after symptoms first appeared AND until fever-free for 24 hours without fever reducer AND other symptoms have improved or resolved. Discussed self-isolation recommendations as well as instructions for household member/close contacts as per the [facility] and [place] [facility], and also gave patient the COVID packet with this information.  Patient/parent/caregiver has been advised to return to the [facility] or PCP in 3-5 days if no better; to PCP or the Emergency Department if new signs and symptoms develop, or if the current signs or symptoms continue to change or worsen for further workup, evaluation and treatment as clinically indicated and appropriate  The patient will follow up with their current PCP if and as advised. If the patient does not currently have a PCP we will assist them in obtaining one.   The patient may need specialty follow up if the symptoms continue, in spite of conservative treatment and management, for further workup, evaluation, consultation and treatment as clinically indicated and appropriate.  Patient/parent/caregiver verbalized understanding and agreement of plan as discussed.  All questions were addressed during visit.  Please  see discharge instructions below  for further details of plan.  ED Prescriptions   None    PDMP not reviewed this encounter.  Pending results:  Labs Reviewed  NOVEL CORONAVIRUS, NAA  POCT RAPID STREP A (OFFICE)  POCT INFLUENZA A/B    Medications Ordered in UC: Medications - No data to display  Discharge Instructions:   Discharge Instructions      Your symptoms are most consistent with a viral upper respiratory illness.  Rapid influenza testing today was negative for influenza.    The results of your respiratory COVID-19 testing will be posted to your MyChart.  If any of the result is positive, you will be contacted by phone and further recommendations, if needed, will be provided to you.  Please remain home from work, school, public places until you have been fever free for 24 hours without the use of antifever medications such as Tylenol or ibuprofen.  Conservative care is recommended at this time.  This includes rest, pushing clear fluids and activity as tolerated.  You may also noticed that your appetite is reduced, this is okay as long as they are drinking plenty of clear fluids.  Acetaminophen (Tylenol): This is a good fever reducer.  If there body temperature rises above 101.5 as measured with a thermometer, it is recommended that you give them 1,000 mg every 6-8 hours until they are temperature falls below 101.5, please not take more than 3,000 mg of acetaminophen either as a separate medication or as in ingredient in an over-the-counter cold/flu preparation within a 24-hour period  Ibuprofen  (Advil, Motrin): This is a good anti-inflammatory medication which addresses aches and pains and, to some degree, congestion in the nasal passages.  I recommend giving between 400 to 600 mg every 6-8 hours as needed.  Pseudoephedrine (Sudafed): This is a decongestant.  This medication has to be purchased from the pharmacist counter, I recommend giving 2 tablets, 60 mg, 2-3 times a day as needed to relieve runny nose  and sinus drainage.  Guaifenesin (Robitussin, Mucinex): This is an expectorant.  This helps break up chest congestion and loosen up thick nasal drainage making phlegm and drainage more liquid and therefore easier to remove.  I recommend being 400 mg three times daily as needed.  Dextromethorphan (any cough medicine with the letters "DM" added to it's name such as Robitussin DM): This is a cough suppressant.  This is often recommended to be taken at nighttime to suppress cough and help children sleep.  Give dosage as directed on the bottle.   Chloraseptic Throat Spray: Spray 5 sprays into affected area every 2 hours, hold for 15 seconds and either swallow or spit it out.  This is a excellent numbing medication because it is a spray, you can put it right where you needed and so sucking on a lozenge and numbing your entire mouth.  Based on my physical exam findings and the history provided  today, I do not see any evidence of bacterial infection therefore treatment with antibiotics would be of no benefit.  Please follow-up within the next 3 to 5 days either with your primary care provider or urgent care if your symptoms do not resolve.  If you do not have a primary care provider, we will assist you in finding one.         Theadora Rama Scales, PA-C 12/01/20 1120

## 2020-12-03 LAB — NOVEL CORONAVIRUS, NAA: SARS-CoV-2, NAA: NOT DETECTED

## 2020-12-03 LAB — SARS-COV-2, NAA 2 DAY TAT

## 2020-12-31 ENCOUNTER — Other Ambulatory Visit: Payer: Self-pay

## 2020-12-31 ENCOUNTER — Ambulatory Visit
Admission: EM | Admit: 2020-12-31 | Discharge: 2020-12-31 | Disposition: A | Discharge status: Home / Self Care | Payer: BC Managed Care – PPO

## 2020-12-31 DIAGNOSIS — J069 Acute upper respiratory infection, unspecified: Secondary | ICD-10-CM

## 2020-12-31 DIAGNOSIS — Z8709 Personal history of other diseases of the respiratory system: Secondary | ICD-10-CM

## 2020-12-31 MED ORDER — CETIRIZINE HCL 10 MG PO TABS: 10.0000 mg | ORAL_TABLET | Freq: Every day | ORAL | 0 refills | Status: DC

## 2020-12-31 MED ORDER — BENZONATATE 100 MG PO CAPS: 100.0000 mg | ORAL_CAPSULE | Freq: Three times a day (TID) | ORAL | 0 refills | Status: DC | PRN

## 2020-12-31 MED ORDER — PROMETHAZINE-DM 6.25-15 MG/5ML PO SYRP: 5.0000 mL | ORAL_SOLUTION | Freq: Every evening | ORAL | 0 refills | Status: DC | PRN

## 2020-12-31 MED ORDER — PREDNISONE 20 MG PO TABS: ORAL_TABLET | ORAL | 0 refills | Status: DC

## 2020-12-31 NOTE — ED Triage Notes (Signed)
Pt present productive coughing with discharge. Symptoms started three days ago. Pt state that it feels like he lymp node is swollen behind his jaw.

## 2020-12-31 NOTE — Discharge Instructions (Signed)
We will notify you of your test results as they arrive and may take between 48-72 hours.  I encourage you to sign up for MyChart if you have not already done so as this can be the easiest way for [place] to communicate results to you online or through a phone app.  Generally, we only contact you if it is a positive test result.  In the meantime, if you develop worsening symptoms including fever, chest pain, shortness of breath despite our current treatment plan then please report to the emergency room as this may be a sign of worsening status from possible viral infection.  Otherwise, we will manage this as a viral syndrome. For sore throat or cough try using a honey-based tea. Use 3 teaspoons of honey with juice squeezed from half lemon. Place shaved pieces of ginger into 1/2-1 cup of water and warm over stove top. Then mix the ingredients and repeat every 4 hours as needed. Please take Tylenol 500mg -650mg  every 6 hours for aches and pains, fevers. Hydrate very well with at least 2 liters of water. Eat light meals such as soups to replenish electrolytes and soft fruits, veggies. Start an antihistamine like Zyrtec for postnasal drainage, sinus congestion.  You can take this together with prednisone given your history of reactive airway disease and your persistent coughing.  Use the cough medications as needed.

## 2020-12-31 NOTE — ED Provider Notes (Signed)
Roberto Thomas   MRN: [id number] DOB: June 04, 1990  Subjective:   Roberto Thomas is a 30 y.o. male presenting for 3 day history of acute onset persistent coughing, productive cough, lymph node pain, malaise. Had 1 sick contact with his father this past weekend. Has a history of reactive airway disease but has not needed treatment for this recently.  No chest pain, shortness of breath, wheezing but he has had some body aches.  Patient is also a smoker.  No current facility-administered medications for this encounter.  Current Outpatient Medications:    sucralfate (CARAFATE) 1 g tablet, 1-2 tablets, Disp: , Rfl:    albuterol (VENTOLIN HFA) 108 (90 Base) MCG/ACT inhaler, Inhale 1-2 puffs into the lungs every 6 (six) hours as needed for wheezing or shortness of breath., Disp: 18 g, Rfl: 0   RABEprazole (ACIPHEX) 20 MG tablet, Take 20 mg by mouth daily., Disp: , Rfl:    Allergies  Allergen Reactions   Naproxen     REACTION: anaphylaxis   Penicillins     REACTION: GI upset/vomiting   Sulfa Antibiotics Rash    Past Medical History:  Diagnosis Date   Deviated septum    GERD (gastroesophageal reflux disease)      Past Surgical History:  Procedure Laterality Date   FRACTURE SURGERY Right    NASAL SEPTOPLASTY W/ TURBINOPLASTY Bilateral 12/19/2012   Procedure: BILATERAL NASAL SEPTOPLASTY WITH BIALTERAL TURBINATE REDUCTION;  Surgeon: Osborn Coho, MD;  Location: [facility];  Service: ENT;  Laterality: Bilateral;    Family History  Problem Relation Age of Onset   Hyperlipidemia Father    Hypertension Father     Social History   Tobacco Use   Smoking status: Some Days    Types: Cigarettes   Smokeless tobacco: Never  Vaping Use   Vaping Use: Some days  Substance Use Topics   Alcohol use: Yes    Comment: socially   Drug use: Yes    Types: Marijuana    ROS   Objective:   Vitals: BP 126/87 (BP Location: Left Arm)    Pulse 84    Temp 99.1 F  (37.3 C) (Oral)    Resp 18    SpO2 96%   Physical Exam Constitutional:      General: He is not in acute distress.    Appearance: Normal appearance. He is well-developed. He is not ill-appearing, toxic-appearing or diaphoretic.  HENT:     Head: Normocephalic and atraumatic.     Right Ear: External ear normal.     Left Ear: External ear normal.     Nose: Nose normal.     Mouth/Throat:     Mouth: Mucous membranes are moist.     Pharynx: No oropharyngeal exudate or posterior oropharyngeal erythema.     Comments: Significant postnasal drainage overlying pharynx. Eyes:     General: No scleral icterus.    Extraocular Movements: Extraocular movements intact.     Pupils: Pupils are equal, round, and reactive to light.  Cardiovascular:     Rate and Rhythm: Normal rate and regular rhythm.     Heart sounds: Normal heart sounds. No murmur heard.   No friction rub. No gallop.  Pulmonary:     Effort: Pulmonary effort is normal. No respiratory distress.     Breath sounds: Normal breath sounds. No stridor. No wheezing, rhonchi or rales.  Neurological:     Mental Status: He is alert and oriented to person, place, and time.  Psychiatric:  Mood and Affect: Mood normal.        Behavior: Behavior normal.        Thought Content: Thought content normal.      Assessment and Plan :   PDMP not reviewed this encounter.  1. Viral URI with cough   2. History of reactive airway disease   3. History of bronchitis    Patient requested antibiotics but I discussed antibiotic stewardship with the and as I suspect this is a viral illness will hold off on this for now.  Deferred imaging given clear cardiopulmonary exam, hemodynamically stable vital signs. COVID and flu test pending.  We will otherwise manage for viral upper respiratory infection.  Physical exam findings reassuring and vital signs stable for discharge.  Recommended an oral prednisone course given his history of reactive airway disease  and persistent coughing.  Advised supportive care, offered symptomatic relief. Counseled patient on potential for adverse effects with medications prescribed/recommended today, ER and return-to-clinic precautions discussed, patient verbalized understanding.      Wallis Bamberg, PA-C 12/31/20 1206

## 2021-01-02 LAB — COVID-19, FLU A+B NAA
Influenza A, NAA: NOT DETECTED
Influenza B, NAA: NOT DETECTED
SARS-CoV-2, NAA: NOT DETECTED

## 2021-02-26 DIAGNOSIS — M2391 Unspecified internal derangement of right knee: Secondary | ICD-10-CM | POA: Diagnosis not present

## 2021-03-06 DIAGNOSIS — M25561 Pain in right knee: Secondary | ICD-10-CM | POA: Diagnosis not present

## 2021-03-29 DIAGNOSIS — S83241A Other tear of medial meniscus, current injury, right knee, initial encounter: Secondary | ICD-10-CM | POA: Diagnosis not present

## 2021-04-13 DIAGNOSIS — Y999 Unspecified external cause status: Secondary | ICD-10-CM | POA: Diagnosis not present

## 2021-04-13 DIAGNOSIS — G8918 Other acute postprocedural pain: Secondary | ICD-10-CM | POA: Diagnosis not present

## 2021-04-13 DIAGNOSIS — S83241A Other tear of medial meniscus, current injury, right knee, initial encounter: Secondary | ICD-10-CM | POA: Diagnosis not present

## 2021-04-13 DIAGNOSIS — M6751 Plica syndrome, right knee: Secondary | ICD-10-CM | POA: Diagnosis not present

## 2021-04-13 DIAGNOSIS — X58XXXA Exposure to other specified factors, initial encounter: Secondary | ICD-10-CM | POA: Diagnosis not present

## 2021-04-20 DIAGNOSIS — M25561 Pain in right knee: Secondary | ICD-10-CM | POA: Diagnosis not present

## 2021-04-22 DIAGNOSIS — M25561 Pain in right knee: Secondary | ICD-10-CM | POA: Diagnosis not present

## 2021-04-26 DIAGNOSIS — M25562 Pain in left knee: Secondary | ICD-10-CM | POA: Diagnosis not present

## 2021-05-17 ENCOUNTER — Encounter (HOSPITAL_COMMUNITY): Payer: Self-pay | Admitting: Specialist

## 2021-05-17 ENCOUNTER — Other Ambulatory Visit: Payer: Self-pay

## 2021-05-17 NOTE — Patient Instructions (Signed)
DUE TO COVID-19 ONLY TWO VISITORS  (aged 31 and older)  IS ALLOWED TO COME WITH YOU AND STAY IN THE WAITING ROOM ONLY DURING PRE OP AND PROCEDURE.   ?**NO VISITORS ARE ALLOWED IN THE SHORT STAY AREA OR RECOVERY ROOM!!** ? ?IF YOU WILL BE ADMITTED INTO THE HOSPITAL YOU ARE ALLOWED ONLY FOUR SUPPORT PEOPLE DURING VISITATION HOURS ONLY (7 AM -8PM)   ?The support person(s) must pass our screening, gel in and out ?Visitors GUEST BADGE MUST BE WORN VISIBLY  ?One adult visitor may remain with you overnight and MUST be in the room by 8 P.M.  ? ?You are not required to quarantine ?Hand Hygiene often ?Do NOT share personal items ?Notify your provider if you are in close contact with someone who has COVID or you develop fever 100.4 or greater, new onset of sneezing, cough, sore throat, shortness of breath or body aches. ? ?     ? Your procedure is scheduled on:  05-18-21 ? ? Report to [facility] Main Entrance ? ?  Report to admitting at 1:45 PM ? ? Call this number if you have problems the morning of surgery [phone] ? ? Do not eat food :After Midnight. ? ? After Midnight you may have the following liquids until 1:00 PM DAY OF SURGERY ? ?Water ?Black Coffee (sugar ok, NO MILK/CREAM OR CREAMERS)  ?Tea (sugar ok, NO MILK/CREAM OR CREAMERS) regular and decaf                             ?Plain Jell-O (NO RED)                                           ?Fruit ices (not with fruit pulp, NO RED)                                     ?Popsicles (NO RED)                                                                  ?Juice: apple, WHITE grape, WHITE cranberry ?Sports drinks like Gatorade (NO RED) ?Clear broth(vegetable,chicken,beef) ?             ?       If you have questions, please contact your surgeon?s office. ? ? ?FOLLOW  ANY ADDITIONAL PRE OP INSTRUCTIONS YOU RECEIVED FROM YOUR SURGEON'S OFFICE!!! ?  ?  ?Oral Hygiene is also important to reduce your risk of infection.                                    ?Remember -  BRUSH YOUR TEETH THE MORNING OF SURGERY WITH YOUR REGULAR TOOTHPASTE ? ? Do NOT smoke after Midnight ? ? Take these medicines the morning of surgery with A SIP OF WATER:  Aciphex, Zyrtec, Doxycycline ?                  ?  You may not have any metal on your body including  jewelry, and body piercing ? ?           Do not wear lotions, powders, cologne, or deodorant ? ?            Men may shave face and neck. ? ? Do not bring valuables to the hospital. [facility] IS NOT RESPONSIBLE   FOR VALUABLES. ? ? Contacts, dentures or bridgework may not be worn into surgery. ? ? Bring small overnight bag day of surgery. ?  ?Patients discharged on the day of surgery will not be allowed to drive home.  Someone NEEDS to stay with you for the first 24 hours after anesthesia. ? ?Please read over the following fact sheets you were given: IF YOU HAVE QUESTIONS ABOUT YOUR PRE-OP INSTRUCTIONS PLEASE CALL [phone] ? ?[facility] - Preparing for Surgery (use regular soap, the hospital usually provides the CHG) ? ?Before surgery, you can play an important role.  Because skin is not sterile, your skin needs to be as free of germs as possible.  You can reduce the number of germs on your skin by washing with CHG (chlorahexidine gluconate) soap before surgery.  CHG is an antiseptic cleaner which kills germs and bonds with the skin to continue killing germs even after washing. ?Please DO NOT use if you have an allergy to CHG or antibacterial soaps.  If your skin becomes reddened/irritated stop using the CHG and inform your nurse when you arrive at Short Stay. ?Do not shave (including legs and underarms) for at least 48 hours prior to the first CHG shower.  You may shave your face/neck. ? ?Please follow these instructions carefully: ? 1.  Shower with CHG Soap the night before surgery and the  morning of surgery. ? 2.  If you choose to wash your hair, wash your hair first as usual with your normal  shampoo. ? 3.  After you shampoo,  rinse your hair and body thoroughly to remove the shampoo.                            ? 4.  Use CHG as you would any other liquid soap.  You can apply chg directly to the skin and wash.  Gently with a scrungie or clean washcloth. ? 5.  Apply the CHG Soap to your body ONLY FROM THE NECK DOWN.   Do   not use on face/ open      ?                     Wound or open sores. Avoid contact with eyes, ears mouth and   genitals (private parts).  ?                     Engineering geologist,  Genitals (private parts) with your normal soap. ?            6.  Wash thoroughly, paying special attention to the area where your    surgery  will be performed. ? 7.  Thoroughly rinse your body with warm water from the neck down. ? 8.  DO NOT shower/wash with your normal soap after using and rinsing off the CHG Soap. ?               9.  Pat yourself dry with a clean towel. ?  10.  Wear clean pajamas. ?           11.  Place clean sheets on your bed the night of your first shower and do not  sleep with pets. ?Day of Surgery : ?Do not apply any lotions/deodorants the morning of surgery.  Please wear clean clothes to the hospital/surgery center. ? ?FAILURE TO FOLLOW THESE INSTRUCTIONS MAY RESULT IN THE CANCELLATION OF YOUR SURGERY ? ?PATIENT SIGNATURE_________________________________ ? ?NURSE SIGNATURE__________________________________ ? ?________________________________________________________________________  ?  ?

## 2021-05-17 NOTE — Progress Notes (Signed)
COVID Vaccine Completed:  Yes  ? ?Date of COVID positive in last 90 days:  No ? ?PCP - No PCP ?Cardiologist - N/A ? ?Chest x-ray - N/A ?EKG - N/A ?Stress Test - N/A ?ECHO - N/A ?Cardiac Cath - N/A ?Pacemaker/ICD device last checked: ?Spinal Cord Stimulator: ? ?Bowel Prep - N/A ? ?Sleep Study - N/A ?CPAP -  ? ?Fasting Blood Sugar - N/A ?Checks Blood Sugar _____ times a day ? ?Blood Thinner Instructions:N/A ?Aspirin Instructions: ?Last Dose: ? ?Activity level:   Can go up a flight of stairs and perform activities of daily living without stopping and without symptoms of chest pain or shortness of breath.  Able to exercise without symptoms ? ?Anesthesia review: N/A ? ?Patient denies shortness of breath, fever,  and chest pain at PAT appointment.  Patient states that he has a chronic cough from GERD and allergies(completed over the phone) ? ? ?Patient verbalized understanding of instructions that were given to them at the PAT appointment. Patient was also instructed that they will need to review over the PAT instructions again at home before surgery.  ?

## 2021-05-17 NOTE — H&P (View-Only) (Signed)
COVID Vaccine Completed:  Yes  ? ?Date of COVID positive in last 90 days:  No ? ?PCP - No PCP ?Cardiologist - N/A ? ?Chest x-ray - N/A ?EKG - N/A ?Stress Test - N/A ?ECHO - N/A ?Cardiac Cath - N/A ?Pacemaker/ICD device last checked: ?Spinal Cord Stimulator: ? ?Bowel Prep - N/A ? ?Sleep Study - N/A ?CPAP -  ? ?Fasting Blood Sugar - N/A ?Checks Blood Sugar _____ times a day ? ?Blood Thinner Instructions:N/A ?Aspirin Instructions: ?Last Dose: ? ?Activity level:   Can go up a flight of stairs and perform activities of daily living without stopping and without symptoms of chest pain or shortness of breath.  Able to exercise without symptoms ? ?Anesthesia review: N/A ? ?Patient denies shortness of breath, fever,  and chest pain at PAT appointment.  Patient states that he has a chronic cough from GERD and allergies(completed over the phone) ? ? ?Patient verbalized understanding of instructions that were given to them at the PAT appointment. Patient was also instructed that they will need to review over the PAT instructions again at home before surgery.  ?

## 2021-05-18 ENCOUNTER — Encounter (HOSPITAL_COMMUNITY)
Admission: RE | Disposition: A | Discharge status: Home / Self Care | Payer: Self-pay | Source: Home / Self Care | Attending: Specialist

## 2021-05-18 ENCOUNTER — Encounter (HOSPITAL_COMMUNITY): Payer: Self-pay | Admitting: Specialist

## 2021-05-18 ENCOUNTER — Observation Stay (HOSPITAL_COMMUNITY)
Admission: RE | Admit: 2021-05-18 | Discharge: 2021-05-19 | Disposition: A | Discharge status: Home / Self Care | Payer: BC Managed Care – PPO | Attending: Specialist | Admitting: Specialist

## 2021-05-18 ENCOUNTER — Ambulatory Visit (HOSPITAL_COMMUNITY): Payer: BC Managed Care – PPO | Admitting: Anesthesiology

## 2021-05-18 DIAGNOSIS — Z7982 Long term (current) use of aspirin: Secondary | ICD-10-CM | POA: Diagnosis not present

## 2021-05-18 DIAGNOSIS — T8141XA Infection following a procedure, superficial incisional surgical site, initial encounter: Secondary | ICD-10-CM | POA: Diagnosis not present

## 2021-05-18 DIAGNOSIS — Z87891 Personal history of nicotine dependence: Secondary | ICD-10-CM | POA: Insufficient documentation

## 2021-05-18 DIAGNOSIS — M659 Synovitis and tenosynovitis, unspecified: Secondary | ICD-10-CM | POA: Diagnosis not present

## 2021-05-18 DIAGNOSIS — M009 Pyogenic arthritis, unspecified: Secondary | ICD-10-CM | POA: Diagnosis present

## 2021-05-18 DIAGNOSIS — Y838 Other surgical procedures as the cause of abnormal reaction of the patient, or of later complication, without mention of misadventure at the time of the procedure: Secondary | ICD-10-CM | POA: Insufficient documentation

## 2021-05-18 DIAGNOSIS — M25161 Fistula, right knee: Secondary | ICD-10-CM | POA: Diagnosis not present

## 2021-05-18 DIAGNOSIS — Z01818 Encounter for other preprocedural examination: Secondary | ICD-10-CM

## 2021-05-18 HISTORY — DX: Other specified postprocedural states: Z98.890

## 2021-05-18 HISTORY — PX: KNEE ARTHROSCOPY: SHX127

## 2021-05-18 LAB — COMPREHENSIVE METABOLIC PANEL
ALT: 15 U/L (ref 0–44)
AST: 16 U/L (ref 15–41)
Albumin: 4.8 g/dL (ref 3.5–5.0)
Alkaline Phosphatase: 49 U/L (ref 38–126)
Anion gap: 11 (ref 5–15)
BUN: 12 mg/dL (ref 6–20)
CO2: 22 mmol/L (ref 22–32)
Calcium: 10.1 mg/dL (ref 8.9–10.3)
Chloride: 105 mmol/L (ref 98–111)
Creatinine, Ser: 1.03 mg/dL (ref 0.61–1.24)
GFR, Estimated: >60 mL/min (ref >60)
Glucose, Bld: 99 mg/dL (ref 70–99)
Potassium: 3.9 mmol/L (ref 3.5–5.1)
Sodium: 138 mmol/L (ref 135–145)
Total Bilirubin: 1 mg/dL (ref 0.3–1.2)
Total Protein: 7.9 g/dL (ref 6.5–8.1)

## 2021-05-18 LAB — CBC
HCT: 47.1 % (ref 39.0–52.0)
Hemoglobin: 16.4 g/dL (ref 13.0–17.0)
MCH: 30.3 pg (ref 26.0–34.0)
MCHC: 34.8 g/dL (ref 30.0–36.0)
MCV: 87.1 fL (ref 80.0–100.0)
Platelets: 319 10*3/uL (ref 150–400)
RBC: 5.41 MIL/uL (ref 4.22–5.81)
RDW: 11.7 % (ref 11.5–15.5)
WBC: 6.5 10*3/uL (ref 4.0–10.5)
nRBC: 0 % (ref 0.0–0.2)

## 2021-05-18 SURGERY — ARTHROSCOPY, KNEE
Anesthesia: General | Site: Knee | Laterality: Right

## 2021-05-18 MED ORDER — ONDANSETRON HCL 4 MG PO TABS: 4.0000 mg | ORAL_TABLET | Freq: Four times a day (QID) | ORAL | Status: DC | PRN

## 2021-05-18 MED ORDER — HYDROMORPHONE HCL 1 MG/ML IJ SOLN
0.5000 mg | INTRAMUSCULAR | Status: DC | PRN
  Administered 2021-05-19: 1 mg via INTRAVENOUS
  Filled 2021-05-18: qty 1

## 2021-05-18 MED ORDER — EPHEDRINE 5 MG/ML INJ
INTRAVENOUS | Status: AC
  Filled 2021-05-18: qty 5

## 2021-05-18 MED ORDER — ONDANSETRON HCL 4 MG/2ML IJ SOLN: 4.0000 mg | Freq: Once | INTRAMUSCULAR | Status: DC | PRN

## 2021-05-18 MED ORDER — FENTANYL CITRATE PF 50 MCG/ML IJ SOSY
PREFILLED_SYRINGE | INTRAMUSCULAR | Status: AC
  Administered 2021-05-18: 50 ug via INTRAVENOUS
  Filled 2021-05-18: qty 2

## 2021-05-18 MED ORDER — BISACODYL 5 MG PO TBEC: 5.0000 mg | DELAYED_RELEASE_TABLET | Freq: Every day | ORAL | Status: DC | PRN

## 2021-05-18 MED ORDER — DEXAMETHASONE SODIUM PHOSPHATE 10 MG/ML IJ SOLN
INTRAMUSCULAR | Status: DC | PRN
  Administered 2021-05-18: 10 mg via INTRAVENOUS

## 2021-05-18 MED ORDER — LIDOCAINE 2% (20 MG/ML) 5 ML SYRINGE
INTRAMUSCULAR | Status: DC | PRN
  Administered 2021-05-18: 60 mg via INTRAVENOUS

## 2021-05-18 MED ORDER — TRAMADOL HCL 50 MG PO TABS
50.0000 mg | ORAL_TABLET | Freq: Four times a day (QID) | ORAL | Status: DC
  Administered 2021-05-18 – 2021-05-19 (×4): 50 mg via ORAL
  Filled 2021-05-18 (×4): qty 1

## 2021-05-18 MED ORDER — METHOCARBAMOL 500 MG IVPB - SIMPLE MED
500.0000 mg | Freq: Four times a day (QID) | INTRAVENOUS | Status: DC | PRN
  Administered 2021-05-18: 500 mg via INTRAVENOUS

## 2021-05-18 MED ORDER — METHOCARBAMOL 500 MG IVPB - SIMPLE MED
INTRAVENOUS | Status: AC
  Filled 2021-05-18: qty 50

## 2021-05-18 MED ORDER — SCOPOLAMINE 1 MG/3DAYS TD PT72
1.0000 | MEDICATED_PATCH | TRANSDERMAL | Status: DC
  Administered 2021-05-18: 1 via TRANSDERMAL

## 2021-05-18 MED ORDER — AMISULPRIDE (ANTIEMETIC) 5 MG/2ML IV SOLN: 10.0000 mg | Freq: Once | INTRAVENOUS | Status: DC | PRN

## 2021-05-18 MED ORDER — FENTANYL CITRATE PF 50 MCG/ML IJ SOSY
25.0000 ug | PREFILLED_SYRINGE | INTRAMUSCULAR | Status: DC | PRN
  Administered 2021-05-18: 50 ug via INTRAVENOUS

## 2021-05-18 MED ORDER — CEFAZOLIN SODIUM-DEXTROSE 2-4 GM/100ML-% IV SOLN
2.0000 g | INTRAVENOUS | Status: DC
  Filled 2021-05-18: qty 100

## 2021-05-18 MED ORDER — CHLORHEXIDINE GLUCONATE 0.12 % MT SOLN
15.0000 mL | Freq: Once | OROMUCOSAL | Status: AC
  Administered 2021-05-18: 15 mL via OROMUCOSAL

## 2021-05-18 MED ORDER — 0.9 % SODIUM CHLORIDE (POUR BTL) OPTIME
TOPICAL | Status: DC | PRN
Start: 2021-05-18 — End: 2021-05-18
  Administered 2021-05-18: 1000 mL

## 2021-05-18 MED ORDER — EPHEDRINE SULFATE-NACL 50-0.9 MG/10ML-% IV SOSY
PREFILLED_SYRINGE | INTRAVENOUS | Status: DC | PRN
  Administered 2021-05-18: 10 mg via INTRAVENOUS

## 2021-05-18 MED ORDER — OXYCODONE HCL 5 MG PO TABS
10.0000 mg | ORAL_TABLET | ORAL | Status: DC | PRN
  Administered 2021-05-18 – 2021-05-19 (×2): 10 mg via ORAL

## 2021-05-18 MED ORDER — BUPIVACAINE HCL (PF) 0.25 % IJ SOLN
INTRAMUSCULAR | Status: AC
  Filled 2021-05-18: qty 30

## 2021-05-18 MED ORDER — OXYCODONE HCL 5 MG/5ML PO SOLN: 5.0000 mg | Freq: Once | ORAL | Status: AC | PRN

## 2021-05-18 MED ORDER — MIDAZOLAM HCL 2 MG/2ML IJ SOLN
INTRAMUSCULAR | Status: AC
  Filled 2021-05-18: qty 2

## 2021-05-18 MED ORDER — DIPHENHYDRAMINE HCL 12.5 MG/5ML PO ELIX: 12.5000 mg | ORAL_SOLUTION | ORAL | Status: DC | PRN

## 2021-05-18 MED ORDER — ENOXAPARIN SODIUM 40 MG/0.4ML IJ SOSY
40.0000 mg | PREFILLED_SYRINGE | INTRAMUSCULAR | Status: DC
  Administered 2021-05-19: 40 mg via SUBCUTANEOUS
  Filled 2021-05-18: qty 0.4

## 2021-05-18 MED ORDER — OXYCODONE HCL 5 MG PO TABS
ORAL_TABLET | ORAL | Status: AC
  Filled 2021-05-18: qty 1

## 2021-05-18 MED ORDER — FENTANYL CITRATE (PF) 100 MCG/2ML IJ SOLN
INTRAMUSCULAR | Status: DC | PRN
  Administered 2021-05-18: 100 ug via INTRAVENOUS
  Administered 2021-05-18 (×3): 50 ug via INTRAVENOUS

## 2021-05-18 MED ORDER — METHOCARBAMOL 500 MG PO TABS: 500.0000 mg | ORAL_TABLET | Freq: Four times a day (QID) | ORAL | Status: DC | PRN

## 2021-05-18 MED ORDER — FENTANYL CITRATE PF 50 MCG/ML IJ SOSY
PREFILLED_SYRINGE | INTRAMUSCULAR | Status: AC
  Administered 2021-05-18: 50 ug via INTRAVENOUS
  Filled 2021-05-18: qty 1

## 2021-05-18 MED ORDER — OXYCODONE HCL 5 MG PO TABS
ORAL_TABLET | ORAL | Status: AC
  Administered 2021-05-18: 10 mg via ORAL
  Filled 2021-05-18: qty 2

## 2021-05-18 MED ORDER — SCOPOLAMINE 1 MG/3DAYS TD PT72
MEDICATED_PATCH | TRANSDERMAL | Status: AC
  Filled 2021-05-18: qty 1

## 2021-05-18 MED ORDER — ORAL CARE MOUTH RINSE: 15.0000 mL | Freq: Once | OROMUCOSAL | Status: AC

## 2021-05-18 MED ORDER — CEFAZOLIN SODIUM-DEXTROSE 1-4 GM/50ML-% IV SOLN
1.0000 g | Freq: Four times a day (QID) | INTRAVENOUS | Status: DC
  Administered 2021-05-18 – 2021-05-19 (×4): 1 g via INTRAVENOUS
  Filled 2021-05-18 (×4): qty 50

## 2021-05-18 MED ORDER — ONDANSETRON HCL 4 MG/2ML IJ SOLN
INTRAMUSCULAR | Status: DC | PRN
  Administered 2021-05-18: 4 mg via INTRAVENOUS

## 2021-05-18 MED ORDER — OXYCODONE HCL 5 MG PO TABS
5.0000 mg | ORAL_TABLET | Freq: Once | ORAL | Status: AC | PRN
  Administered 2021-05-18: 5 mg via ORAL

## 2021-05-18 MED ORDER — PROPOFOL 10 MG/ML IV BOLUS
INTRAVENOUS | Status: DC | PRN
  Administered 2021-05-18: 200 mg via INTRAVENOUS

## 2021-05-18 MED ORDER — ACETAMINOPHEN 325 MG PO TABS: 325.0000 mg | ORAL_TABLET | Freq: Four times a day (QID) | ORAL | Status: DC | PRN

## 2021-05-18 MED ORDER — LACTATED RINGERS IV SOLN: INTRAVENOUS | Status: DC

## 2021-05-18 MED ORDER — ONDANSETRON HCL 4 MG/2ML IJ SOLN: 4.0000 mg | Freq: Four times a day (QID) | INTRAMUSCULAR | Status: DC | PRN

## 2021-05-18 MED ORDER — MIDAZOLAM HCL 5 MG/5ML IJ SOLN
INTRAMUSCULAR | Status: DC | PRN
  Administered 2021-05-18: 2 mg via INTRAVENOUS

## 2021-05-18 MED ORDER — SENNOSIDES-DOCUSATE SODIUM 8.6-50 MG PO TABS: 1.0000 | ORAL_TABLET | Freq: Every evening | ORAL | Status: DC | PRN

## 2021-05-18 MED ORDER — ONDANSETRON HCL 4 MG/2ML IJ SOLN
INTRAMUSCULAR | Status: AC
  Filled 2021-05-18: qty 2

## 2021-05-18 MED ORDER — PROPOFOL 10 MG/ML IV BOLUS
INTRAVENOUS | Status: AC
Start: 2021-05-18 — End: ?
  Filled 2021-05-18: qty 20

## 2021-05-18 MED ORDER — SODIUM CHLORIDE 0.9 % IR SOLN
Status: DC | PRN
  Administered 2021-05-18: 9000 mL

## 2021-05-18 MED ORDER — OXYCODONE HCL 5 MG PO TABS
5.0000 mg | ORAL_TABLET | ORAL | Status: DC | PRN
  Administered 2021-05-19: 10 mg via ORAL
  Filled 2021-05-18 (×3): qty 2

## 2021-05-18 MED ORDER — ACETAMINOPHEN 500 MG PO TABS
1000.0000 mg | ORAL_TABLET | Freq: Four times a day (QID) | ORAL | Status: AC
  Administered 2021-05-18 – 2021-05-19 (×4): 1000 mg via ORAL
  Filled 2021-05-18 (×4): qty 2

## 2021-05-18 MED ORDER — ACETAMINOPHEN 500 MG PO TABS
1000.0000 mg | ORAL_TABLET | Freq: Once | ORAL | Status: AC
  Administered 2021-05-18: 1000 mg via ORAL
  Filled 2021-05-18: qty 2

## 2021-05-18 MED ORDER — FENTANYL CITRATE (PF) 250 MCG/5ML IJ SOLN
INTRAMUSCULAR | Status: AC
  Filled 2021-05-18: qty 5

## 2021-05-18 SURGICAL SUPPLY — 45 items
BAG COUNTER SPONGE SURGICOUNT (BAG) ×1 IMPLANT
BANDAGE ESMARK 6X9 LF (GAUZE/BANDAGES/DRESSINGS) ×1 IMPLANT
BLADE EXCALIBUR 4.0X13 (MISCELLANEOUS) IMPLANT
BNDG ELASTIC 6X5.8 VLCR STR LF (GAUZE/BANDAGES/DRESSINGS) ×1 IMPLANT
BNDG ESMARK 6X9 LF (GAUZE/BANDAGES/DRESSINGS) ×2
BNDG GAUZE ELAST 4 BULKY (GAUZE/BANDAGES/DRESSINGS) ×2 IMPLANT
CNTNR URN SCR LID CUP LEK RST (MISCELLANEOUS) IMPLANT
CONT SPEC 4OZ STRL OR WHT (MISCELLANEOUS)
CUFF TOURN SGL QUICK 34 (TOURNIQUET CUFF)
CUFF TRNQT CYL 34X4.125X (TOURNIQUET CUFF) ×2 IMPLANT
DRAPE INCISE IOBAN 66X45 STRL (DRAPES) ×2 IMPLANT
DRAPE U-SHAPE 47X51 STRL (DRAPES) ×2 IMPLANT
DRSG PAD ABDOMINAL 8X10 ST (GAUZE/BANDAGES/DRESSINGS) ×4 IMPLANT
DURAPREP 26ML APPLICATOR (WOUND CARE) ×2 IMPLANT
DW OUTFLOW CASSETTE/TUBE SET (MISCELLANEOUS) ×2 IMPLANT
EVACUATOR 1/8 PVC DRAIN (DRAIN) ×1 IMPLANT
EXCALIBUR 3.8MM X 13CM (MISCELLANEOUS) ×2 IMPLANT
GAUZE 4X4 16PLY ~~LOC~~+RFID DBL (SPONGE) ×2 IMPLANT
GAUZE PAD ABD 8X10 STRL (GAUZE/BANDAGES/DRESSINGS) ×1 IMPLANT
GAUZE SPONGE 4X4 12PLY STRL (GAUZE/BANDAGES/DRESSINGS) ×2 IMPLANT
GAUZE XEROFORM 1X8 LF (GAUZE/BANDAGES/DRESSINGS) ×2 IMPLANT
GLOVE BIO SURGEON STRL SZ7 (GLOVE) ×2 IMPLANT
GLOVE BIO SURGEON STRL SZ8 (GLOVE) ×2 IMPLANT
GLOVE BIOGEL PI IND STRL 7.5 (GLOVE) ×1 IMPLANT
GLOVE BIOGEL PI INDICATOR 7.5 (GLOVE) ×1
GLOVE INDICATOR 8.0 STRL GRN (GLOVE) ×2 IMPLANT
IMMOBILIZER KNEE 20 (SOFTGOODS) ×2 IMPLANT
IMMOBILIZER KNEE 20 THIGH 36 (SOFTGOODS) IMPLANT
IV NS IRRIG 3000ML ARTHROMATIC (IV SOLUTION) ×4 IMPLANT
KIT BASIN OR (CUSTOM PROCEDURE TRAY) ×2 IMPLANT
MANIFOLD NEPTUNE II (INSTRUMENTS) ×2 IMPLANT
NEEDLE HYPO 22GX1.5 SAFETY (NEEDLE) ×1 IMPLANT
NS IRRIG 1000ML POUR BTL (IV SOLUTION) ×1 IMPLANT
PACK ARTHROSCOPY DSU (CUSTOM PROCEDURE TRAY) ×2 IMPLANT
PAD ARMBOARD 7.5X6 YLW CONV (MISCELLANEOUS) IMPLANT
SUT ETHILON 4 0 PS 2 18 (SUTURE) ×2 IMPLANT
SWAB COLLECTION DEVICE MRSA (MISCELLANEOUS) ×2 IMPLANT
SWAB CULTURE ESWAB REG 1ML (MISCELLANEOUS) ×2 IMPLANT
SYR CONTROL 10ML LL (SYRINGE) ×1 IMPLANT
TOWEL OR 17X26 10 PK STRL BLUE (TOWEL DISPOSABLE) ×2 IMPLANT
TUBING ARTHROSCOPY IRRIG 16FT (MISCELLANEOUS) ×1 IMPLANT
TUBING CONNECTING 10 (TUBING) ×1 IMPLANT
WAND APOLLORF SJ50 AR-9845 (SURGICAL WAND) ×1 IMPLANT
WATER STERILE IRR 500ML POUR (IV SOLUTION) ×1 IMPLANT
WRAP KNEE MAXI GEL POST OP (GAUZE/BANDAGES/DRESSINGS) ×1 IMPLANT

## 2021-05-18 NOTE — Interval H&P Note (Signed)
History and Physical Interval Note: ? ?05/18/2021 ?4:17 PM ? ?Roberto Thomas  has presented today for surgery, with the diagnosis of Right knee synovitis, possible septic arthritis.  The various methods of treatment have been discussed with the patient and family. After consideration of risks, benefits and other options for treatment, the patient has consented to  Procedure(s) with comments: ?ARTHROSCOPY KNEE IRRIGATION AND DEBRIDEMENT (Right) - 60 as a surgical intervention.  The patient's history has been reviewed, patient examined, no change in status, stable for surgery.  I have reviewed the patient's chart and labs.  Questions were answered to the patient's satisfaction.   ? ? ?Aarionna Germer ANDREW ? ? ?

## 2021-05-18 NOTE — Op Note (Signed)
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 ? ?Operative details patient encountered in the holding area correct site identified marked signed appropriately take the operating room placed supine position under general anesthesia.  Right lower extremity elevated prepped with DuraPrep draped in usual sterile fashion.  The old opened inferior lateral portal was excised sharply with a 15 blade.  No purulence or pus was encountered.  Separate instruments were used for the arthroscopy inferomedial portal was established.  Knee was thoroughly inspected arthroscopically there was no effusion at all no evidence of infection no evidence of active synovitis no fibrinous exudate and a very benign intra-articular appearance IV antibiotics were given.  Prior to the arthroscopy intraoperative cultures were taken from the joint via swabs.  After copious irrigation multiple liters of saline I medial Hemovac drain was placed in the inferior lateral portal area closed with a nylon suture.  Drain was hooked to suction.  Sterile dressing was applied compressive wrap and knee immobilizer.  No complications or problems.  He will be discharged from the OR to the PACU then to an upstairs.  Infectious disease consult tomorrow. ? ?Overall, this did not appear to be an active infection but I do believe the joint must have been contaminated by the arthro cutaneous  fistula. ? ?

## 2021-05-18 NOTE — H&P (Signed)
Roberto Thomas is an 31 y.o. male.   ?Chief Complaint: Right knee pain ?HPI: Patient is 5 weeks s/p right knee scope, PMM. He unfortunately has been having difficulty with his knee since about 3 weeks after surgery. He has been having draining from incision site and pain and swelling. He has tried PO abx with minimal relief.  ? ?Past Medical History:  ?Diagnosis Date  ? Deviated septum   ? GERD (gastroesophageal reflux disease)   ? PONV (postoperative nausea and vomiting)   ? ? ?Past Surgical History:  ?Procedure Laterality Date  ? FRACTURE SURGERY Right   ? KNEE ARTHROSCOPY Right   ? NASAL SEPTOPLASTY W/ TURBINOPLASTY Bilateral 12/19/2012  ? Procedure: BILATERAL NASAL SEPTOPLASTY WITH BIALTERAL TURBINATE REDUCTION;  Surgeon: Osborn Coho, MD;  Location: [facility];  Service: ENT;  Laterality: Bilateral;  ? SHOULDER ARTHROSCOPY Left   ? WISDOM TOOTH EXTRACTION    ? ? ?Family History  ?Problem Relation Age of Onset  ? Hyperlipidemia Father   ? Hypertension Father   ? ?Social History:  reports that he has quit smoking. His smoking use included cigarettes. He has never used smokeless tobacco. He reports current alcohol use. He reports current drug use. Drug: Marijuana. ? ?Allergies:  ?Allergies  ?Allergen Reactions  ? Naproxen Anaphylaxis  ? Penicillins Nausea And Vomiting  ?  GI upset  ? Sulfa Antibiotics Rash  ? ? ?Medications Prior to Admission  ?Medication Sig Dispense Refill  ? aspirin EC 81 MG tablet Take 81 mg by mouth 2 (two) times daily. Swallow whole.    ? cetirizine (ZYRTEC ALLERGY) 10 MG tablet Take 1 tablet (10 mg total) by mouth daily. 30 tablet 0  ? doxycycline (VIBRAMYCIN) 100 MG capsule Take 100 mg by mouth 2 (two) times daily.    ? ibuprofen (ADVIL) 200 MG tablet Take 400-600 mg by mouth every 6 (six) hours as needed for moderate pain.    ? methocarbamol (ROBAXIN) 500 MG tablet Take 500 mg by mouth 4 (four) times daily as needed for muscle spasms.    ? oxyCODONE (OXY IR/ROXICODONE) 5  MG immediate release tablet Take 5 mg by mouth every 4 (four) hours as needed for pain.    ? RABEprazole (ACIPHEX) 20 MG tablet Take 20 mg by mouth 2 (two) times daily.    ? promethazine (PHENERGAN) 25 MG tablet Take 25 mg by mouth every 4 (four) hours as needed.    ? ? ?Results for orders placed or performed during the hospital encounter of 05/18/21 (from the past 48 hour(s))  ?CBC     Status: None  ? Collection Time: 05/18/21  2:04 PM  ?Result Value Ref Range  ? WBC 6.5 4.0 - 10.5 K/uL  ? RBC 5.41 4.22 - 5.81 MIL/uL  ? Hemoglobin 16.4 13.0 - 17.0 g/dL  ? HCT 47.1 39.0 - 52.0 %  ? MCV 87.1 80.0 - 100.0 fL  ? MCH 30.3 26.0 - 34.0 pg  ? MCHC 34.8 30.0 - 36.0 g/dL  ? RDW 11.7 11.5 - 15.5 %  ? Platelets 319 150 - 400 K/uL  ? nRBC 0.0 0.0 - 0.2 %  ?  Comment: Performed at [facility], [address], [place], [place] [address]  ?Comprehensive metabolic panel     Status: None  ? Collection Time: 05/18/21  2:04 PM  ?Result Value Ref Range  ? Sodium 138 135 - 145 mmol/L  ? Potassium 3.9 3.5 - 5.1 mmol/L  ? Chloride  105 98 - 111 mmol/L  ? CO2 22 22 - 32 mmol/L  ? Glucose, Bld 99 70 - 99 mg/dL  ?  Comment: Glucose reference range applies only to samples taken after fasting for at least 8 hours.  ? BUN 12 6 - 20 mg/dL  ? Creatinine, Ser 1.03 0.61 - 1.24 mg/dL  ? Calcium 10.1 8.9 - 10.3 mg/dL  ? Total Protein 7.9 6.5 - 8.1 g/dL  ? Albumin 4.8 3.5 - 5.0 g/dL  ? AST 16 15 - 41 U/L  ? ALT 15 0 - 44 U/L  ? Alkaline Phosphatase 49 38 - 126 U/L  ? Total Bilirubin 1.0 0.3 - 1.2 mg/dL  ? GFR, Estimated >60 >60 mL/min  ?  Comment: (NOTE) ?Calculated using the CKD-EPI Creatinine Equation (2021) ?  ? Anion gap 11 5 - 15  ?  Comment: Performed at [facility], [address], [place], [place] [address]  ? ?No results found. ? ?Review of Systems  ?All other systems reviewed and are negative. ? ?Blood pressure (!) 142/89, pulse 91, temperature 98 ?F (36.7 ?C), temperature source Oral, resp. rate 16,  height 6\' 2"  (1.88 m), weight 77.1 kg, SpO2 100 %. ?Physical Exam ?Musculoskeletal:  ?   Comments: On examination of the right knee, his inferolateral portal site is open. There is a bit of synovial fluid from this. It appears serous in nature. The knee joint itself is not warm or erythematous. His range of motion is about 5-100 degrees. Calf is soft. No palpable lymphadenopathy. No palpable effusion.  ?  ? ?Assessment/Plan ?Right knee s/p scope possible infection vs synovitis ?Patient has a possible right knee infection. He is being taken to the OR for a washout. Risks and benefits of this explained to patient. He will spend the night and wait for cultures to come back. All of this was explained to the patient in the office. ? ? , PA ?05/18/2021, 3:31 PM ? ? ? ?

## 2021-05-18 NOTE — Anesthesia Preprocedure Evaluation (Addendum)
Anesthesia Evaluation  ?Patient identified by MRN, date of birth, ID band ?Patient awake ? ? ? ?Reviewed: ?Allergy & Precautions, NPO status , Patient's Chart, lab work & pertinent test results ? ?History of Anesthesia Complications ?(+) PONV and history of anesthetic complications ? ?Airway ?Mallampati: I ? ?TM Distance: >3 FB ?Neck ROM: Full ? ? ? Dental ?no notable dental hx. ? ?  ?Pulmonary ?neg pulmonary ROS, former smoker,  ?  ?Pulmonary exam normal ?breath sounds clear to auscultation ? ? ? ? ? ? Cardiovascular ?Exercise Tolerance: Good ?negative cardio ROS ?Normal cardiovascular exam ?Rhythm:Regular Rate:Normal ? ? ?  ?Neuro/Psych ?negative neurological ROS ? negative psych ROS  ? GI/Hepatic ?GERD  ,(+)  ?  ? substance abuse ? marijuana use,   ?Endo/Other  ?negative endocrine ROS ? Renal/GU ?negative Renal ROS  ?negative genitourinary ?  ?Musculoskeletal ?negative musculoskeletal ROS ?(+)  ? Abdominal ?  ?Peds ?negative pediatric ROS ?(+)  Hematology ?negative hematology ROS ?(+)   ?Anesthesia Other Findings ? ? Reproductive/Obstetrics ?negative OB ROS ? ?  ? ? ? ? ? ? ? ? ? ? ? ? ? ?  ?  ? ? ? ? ? ? ? ?Anesthesia Physical ?Anesthesia Plan ? ?ASA: 2 ? ?Anesthesia Plan: General  ? ?Post-op Pain Management: Tylenol PO (pre-op)*  ? ?Induction: Intravenous ? ?PONV Risk Score and Plan: 3 and Treatment may vary due to age or medical condition, Ondansetron, Dexamethasone and Midazolam ? ?Airway Management Planned: LMA ? ?Additional Equipment: None ? ?Intra-op Plan:  ? ?Post-operative Plan: Extubation in OR ? ?Informed Consent: I have reviewed the patients History and Physical, chart, labs and discussed the procedure including the risks, benefits and alternatives for the proposed anesthesia with the patient or authorized representative who has indicated his/her understanding and acceptance.  ? ? ? ?Dental advisory given ? ?Plan Discussed with: CRNA, Anesthesiologist and  Surgeon ? ?Anesthesia Plan Comments:   ? ? ? ? ? ? ?Anesthesia Quick Evaluation ? ?

## 2021-05-18 NOTE — Plan of Care (Signed)
Problem: Education: ?Goal: Knowledge of General Education information will improve ?Description: Including pain rating scale, medication(s)/side effects and non-pharmacologic comfort measures ?Outcome: Progressing ?  ?Problem: Health Behavior/Discharge Planning: ?Goal: Ability to manage health-related needs will improve ?Outcome: Progressing ?  ?Problem: Clinical Measurements: ?Goal: Ability to maintain clinical measurements within normal limits will improve ?Outcome: Progressing ?  ?Haydee Salter, RN ?05/18/21 ?7:50 PM ? ?

## 2021-05-18 NOTE — Transfer of Care (Signed)
Immediate Anesthesia Transfer of Care Note ? ?Patient: Roberto Thomas ? ?Procedure(s) Performed: ARTHROSCOPY KNEE IRRIGATION AND DEBRIDEMENT (Right: Knee) ? ?Patient Location: PACU ? ?Anesthesia Type:General ? ?Level of Consciousness: awake, alert  and oriented ? ?Airway & Oxygen Therapy: Patient Spontanous Breathing and Patient connected to face mask oxygen ? ?Post-op Assessment: Report given to RN and Post -op Vital signs reviewed and stable ? ?Post vital signs: Reviewed and stable ? ?Last Vitals:  ?Vitals Value Taken Time  ?BP 128/87 05/18/21 1730  ?Temp    ?Pulse 83 05/18/21 1730  ?Resp 21 05/18/21 1730  ?SpO2 100 % 05/18/21 1730  ?Vitals shown include unvalidated device data. ? ?Last Pain:  ?Vitals:  ? 05/18/21 1428  ?TempSrc:   ?PainSc: 6   ?   ? ?Patients Stated Pain Goal: 5 (05/18/21 1428) ? ?Complications: No notable events documented. ?

## 2021-05-18 NOTE — Anesthesia Postprocedure Evaluation (Signed)
Anesthesia Post Note ? ?Patient: Roberto Thomas ? ?Procedure(s) Performed: ARTHROSCOPY KNEE IRRIGATION AND DEBRIDEMENT (Right: Knee) ? ?  ? ?Patient location during evaluation: PACU ?Anesthesia Type: General ?Level of consciousness: awake ?Pain management: pain level controlled ?Vital Signs Assessment: post-procedure vital signs reviewed and stable ?Respiratory status: spontaneous breathing and respiratory function stable ?Cardiovascular status: stable ?Postop Assessment: no apparent nausea or vomiting ?Anesthetic complications: no ? ? ?No notable events documented. ? ?Last Vitals:  ?Vitals:  ? 05/18/21 1745 05/18/21 1800  ?BP: 128/87 121/77  ?Pulse: 80 65  ?Resp: 17 12  ?Temp:    ?SpO2: 100% 100%  ?  ?Last Pain:  ?Vitals:  ? 05/18/21 1800  ?TempSrc:   ?PainSc: Asleep  ? ? ?  ?  ?  ?  ?  ?  ? ?Mellody Dance ? ? ? ? ?

## 2021-05-18 NOTE — Anesthesia Procedure Notes (Signed)
Procedure Name: LMA Insertion ?Date/Time: 05/18/2021 4:34 PM ?Performed by: Kizzie Fantasia, CRNA ?Pre-anesthesia Checklist: Patient identified, Emergency Drugs available, Suction available, Patient being monitored and Timeout performed ?Patient Re-evaluated:Patient Re-evaluated prior to induction ?Oxygen Delivery Method: Circle system utilized ?Preoxygenation: Pre-oxygenation with 100% oxygen ?Induction Type: IV induction ?Ventilation: Mask ventilation without difficulty ?LMA: LMA inserted ?LMA Size: 5.0 ?Number of attempts: 1 ?Placement Confirmation: positive ETCO2 and breath sounds checked- equal and bilateral ?Tube secured with: Tape ?Dental Injury: Teeth and Oropharynx as per pre-operative assessment  ? ? ? ? ?

## 2021-05-19 ENCOUNTER — Encounter (HOSPITAL_COMMUNITY): Payer: Self-pay | Admitting: Specialist

## 2021-05-19 ENCOUNTER — Other Ambulatory Visit (HOSPITAL_COMMUNITY): Payer: Self-pay

## 2021-05-19 DIAGNOSIS — Z87891 Personal history of nicotine dependence: Secondary | ICD-10-CM | POA: Diagnosis not present

## 2021-05-19 DIAGNOSIS — Z7982 Long term (current) use of aspirin: Secondary | ICD-10-CM | POA: Diagnosis not present

## 2021-05-19 DIAGNOSIS — M00861 Arthritis due to other bacteria, right knee: Secondary | ICD-10-CM | POA: Diagnosis not present

## 2021-05-19 DIAGNOSIS — Y838 Other surgical procedures as the cause of abnormal reaction of the patient, or of later complication, without mention of misadventure at the time of the procedure: Secondary | ICD-10-CM | POA: Diagnosis not present

## 2021-05-19 DIAGNOSIS — T8141XA Infection following a procedure, superficial incisional surgical site, initial encounter: Secondary | ICD-10-CM | POA: Diagnosis not present

## 2021-05-19 LAB — CBC
HCT: 42.8 % (ref 39.0–52.0)
Hemoglobin: 14.4 g/dL (ref 13.0–17.0)
MCH: 29.8 pg (ref 26.0–34.0)
MCHC: 33.6 g/dL (ref 30.0–36.0)
MCV: 88.6 fL (ref 80.0–100.0)
Platelets: 319 10*3/uL (ref 150–400)
RBC: 4.83 MIL/uL (ref 4.22–5.81)
RDW: 11.7 % (ref 11.5–15.5)
WBC: 7.3 10*3/uL (ref 4.0–10.5)
nRBC: 0 % (ref 0.0–0.2)

## 2021-05-19 LAB — HIV ANTIBODY (ROUTINE TESTING W REFLEX): HIV Screen 4th Generation wRfx: NONREACTIVE

## 2021-05-19 LAB — C-REACTIVE PROTEIN: CRP: <0.5 mg/dL (ref <1.0)

## 2021-05-19 LAB — SEDIMENTATION RATE: Sed Rate: 1 mm/hr (ref 0–16)

## 2021-05-19 MED ORDER — LINEZOLID 600 MG PO TABS
600.0000 mg | ORAL_TABLET | Freq: Two times a day (BID) | ORAL | 0 refills | Status: AC
Start: 2021-05-19 — End: 2021-06-02

## 2021-05-19 MED ORDER — HYDROMORPHONE HCL 2 MG PO TABS: 2.0000 mg | ORAL_TABLET | Freq: Two times a day (BID) | ORAL | 0 refills | Status: AC | PRN

## 2021-05-19 MED ORDER — OXYCODONE HCL 5 MG PO TABS: 5.0000 mg | ORAL_TABLET | Freq: Four times a day (QID) | ORAL | 0 refills | Status: DC | PRN

## 2021-05-19 MED ORDER — PANTOPRAZOLE SODIUM 40 MG PO TBEC
40.0000 mg | DELAYED_RELEASE_TABLET | Freq: Every day | ORAL | Status: DC
Start: 2021-05-19 — End: 2021-05-19
  Administered 2021-05-19: 40 mg via ORAL
  Filled 2021-05-19: qty 1

## 2021-05-19 MED ORDER — LORATADINE 10 MG PO TABS
10.0000 mg | ORAL_TABLET | Freq: Every day | ORAL | Status: DC
  Administered 2021-05-19: 10 mg via ORAL
  Filled 2021-05-19: qty 1

## 2021-05-19 NOTE — Discharge Summary (Signed)
Physician Discharge Summary  Patient ID: Roberto Thomas MRN: [id number] DOB/AGE: December 23, 1990 31 y.o.  Admit date: 05/18/2021 Discharge date: 05/19/2021  Admission Diagnoses: Right knee infection   Discharge Diagnoses:  Principal Problem:   Infection of right knee [facility])   Discharged Condition: good  Hospital Course: Patient was admitted on May 16 for a possible right knee infection status post arthroscopic surgery about 5 weeks ago.  He had a right knee I&D on May 16.  There is no active infection noted during the washout.  He was placed on IV antibiotics overnight.  Infectious disease was consulted the next morning.  Overall patient is doing okay, he is having a lot of pain in the knee postoperative day 1.  Dilaudid and oxycodone was given which the Dilaudid seemed to help will be better.  Drain was removed midway through postop day 1.  Patient is okay to be discharged today.  He will be going home on p.o. antibiotics as well as p.o. pain medication oxycodone and Dilaudid.  Patient already has muscle relaxers at home from previous surgery.  He is going to follow-up in orthopedics office in 2 weeks as well as infectious disease office in 2 weeks.  Consults: ID  Significant Diagnostic Studies: labs: WNL  and microbiology: wound culture: Cultures at this time are negative  Treatments: IV hydration, antibiotics: Ancef, analgesia: acetaminophen, Dilaudid, and oxycodone, and surgery: right knee I and D  Discharge Exam: Blood pressure 125/72, pulse (!) 56, temperature 97.9 F (36.6 C), temperature source Oral, resp. rate 17, height 6\' 2"  (1.88 m), weight 77.1 kg, SpO2 98 %. General appearance: alert, cooperative, appears stated age, and no distress Extremities: Limited ROM of right knee, due to recent surgery. Some mild pain with ankle dorsiflexion and plantar flexion. Extremities normal, atraumatic, no cyanosis or edema and Homans sign is negative, no sign of DVT Pulses: 2+ and symmetric Skin:  Skin color, texture, turgor normal. No rashes or lesions Incision/Wound: Incision site looks good, No drainage, errythema  Disposition: Discharge disposition: 01-Home or Self Care       Discharge Instructions     Call MD / Call 911   Complete by: As directed    If you experience chest pain or shortness of breath, CALL 911 and be transported to the hospital emergency room.  If you develope a fever above 101 F, pus (white drainage) or increased drainage or redness at the wound, or calf pain, call your surgeon's office.   Constipation Prevention   Complete by: As directed    Drink plenty of fluids.  Prune juice may be helpful.  You may use a stool softener, such as Colace (over the counter) 100 mg twice a day.  Use MiraLax (over the counter) for constipation as needed.   Diet - low sodium heart healthy   Complete by: As directed    Discharge instructions   Complete by: As directed    Follow up in office in 2 weeks Knee immobilizer when out of bed  Weight bearing as tolerated ASA 81 mg once daily for DVT ppx   Patient may shower   Complete by: As directed    You may shower without a dressing once there is no drainage.  Do not wash over the wound.  If drainage remains, cover wound with plastic wrap and then shower.   Post-operative opioid taper instructions:   Complete by: As directed    POST-OPERATIVE OPIOID TAPER INSTRUCTIONS: It is important to wean off of your  opioid medication as soon as possible. If you do not need pain medication after your surgery it is ok to stop day one. Opioids include: Codeine, Hydrocodone(Norco, Vicodin), Oxycodone(Percocet, oxycontin) and hydromorphone amongst others.  Long term and even short term use of opiods can cause: Increased pain response Dependence Constipation Depression Respiratory depression And more.  Withdrawal symptoms can include Flu like symptoms Nausea, vomiting And more Techniques to manage these symptoms Hydrate well Eat  regular healthy meals Stay active Use relaxation techniques(deep breathing, meditating, yoga) Do Not substitute Alcohol to help with tapering If you have been on opioids for less than two weeks and do not have pain than it is ok to stop all together.  Plan to wean off of opioids This plan should start within one week post op of your joint replacement. Maintain the same interval or time between taking each dose and first decrease the dose.  Cut the total daily intake of opioids by one tablet each day Next start to increase the time between doses. The last dose that should be eliminated is the evening dose.      Weight bearing as tolerated   Complete by: As directed       Allergies as of 05/19/2021       Reactions   Naproxen Anaphylaxis   Penicillins Nausea And Vomiting   GI upset   Sulfa Antibiotics Rash        Medication List     STOP taking these medications    doxycycline 100 MG capsule Commonly known as: VIBRAMYCIN       TAKE these medications    aspirin EC 81 MG tablet Take 81 mg by mouth 2 (two) times daily. Swallow whole.   cetirizine 10 MG tablet Commonly known as: ZyrTEC Allergy Take 1 tablet (10 mg total) by mouth daily.   HYDROmorphone 2 MG tablet Commonly known as: Dilaudid Take 1 tablet (2 mg total) by mouth every 12 (twelve) hours as needed for up to 5 days for severe pain.   ibuprofen 200 MG tablet Commonly known as: ADVIL Take 400-600 mg by mouth every 6 (six) hours as needed for moderate pain.   linezolid 600 MG tablet Commonly known as: ZYVOX Take 1 tablet (600 mg total) by mouth 2 (two) times daily for 14 days.   methocarbamol 500 MG tablet Commonly known as: ROBAXIN Take 500 mg by mouth 4 (four) times daily as needed for muscle spasms.   oxyCODONE 5 MG immediate release tablet Commonly known as: Oxy IR/ROXICODONE Take 5 mg by mouth every 4 (four) hours as needed for pain. What changed: Another medication with the same name was  added. Make sure you understand how and when to take each.   oxyCODONE 5 MG immediate release tablet Commonly known as: Roxicodone Take 1 tablet (5 mg total) by mouth every 6 (six) hours as needed for moderate pain. What changed: You were already taking a medication with the same name, and this prescription was added. Make sure you understand how and when to take each.   promethazine 25 MG tablet Commonly known as: PHENERGAN Take 25 mg by mouth every 4 (four) hours as needed.   RABEprazole 20 MG tablet Commonly known as: ACIPHEX Take 20 mg by mouth 2 (two) times daily.               Discharge Care Instructions  (From admission, onward)           Start     Ordered  05/19/21 0000  Weight bearing as tolerated        05/19/21 1204             Signed: Drue Novel, PA-C Dr. Sydnee Cabal, [facility] [phone] 05/19/2021, 12:05 PM

## 2021-05-19 NOTE — Progress Notes (Signed)
Subjective: ?1 Day Post-Op Procedure(s) (LRB): ?ARTHROSCOPY KNEE IRRIGATION AND DEBRIDEMENT (Right) ?Patient reports pain as 9 on 0-10 scale.   ?Patient reported a lot of pain overnight.   ?Denies any fevers or chills at this time. ? ?Objective: ?Vital signs in last 24 hours: ?Temp:  [97.4 ?F (36.3 ?C)-98.2 ?F (36.8 ?C)] 97.9 ?F (36.6 ?C) (05/17 0602) ?Pulse Rate:  [55-91] 55 (05/17 0602) ?Resp:  [12-21] 17 (05/17 0602) ?BP: (111-142)/(72-93) 117/74 (05/17 0602) ?SpO2:  [95 %-100 %] 97 % (05/17 0602) ?Weight:  [77.1 kg] 77.1 kg (05/16 1428) ? ?Intake/Output from previous day: ?05/16 0701 - 05/17 0700 ?In: 2110 [P.O.:960; I.V.:1000; IV Piggyback:150] ?Out: 745 [Urine:710; Drains:35] ?Intake/Output this shift: ?No intake/output data recorded. ? ?Recent Labs  ?  05/18/21 ?1404 05/19/21 ?0342  ?HGB 16.4 14.4  ? ?Recent Labs  ?  05/18/21 ?1404 05/19/21 ?0342  ?WBC 6.5 7.3  ?RBC 5.41 4.83  ?HCT 47.1 42.8  ?PLT 319 319  ? ?Recent Labs  ?  05/18/21 ?1404  ?NA 138  ?K 3.9  ?CL 105  ?CO2 22  ?BUN 12  ?CREATININE 1.03  ?GLUCOSE 99  ?CALCIUM 10.1  ? ?No results for input(s): LABPT, INR in the last 72 hours. ? ?They were able to take out about 35 cc of fluid from the Hemovac drain yesterday at this time there appears to be about 15 cc of fluid still remaining in the drain ? ?Neurologically intact ?Neurovascular intact ?Sensation intact distally ?Intact pulses distally ?Dorsiflexion/Plantar flexion intact ?Compartment soft ? ? ?Assessment/Plan: ?1 Day Post-Op Procedure(s) (LRB): ?ARTHROSCOPY KNEE IRRIGATION AND DEBRIDEMENT (Right) ?Advance diet ?Culture still pending, infectious disease consulted for antibiotic management ?Drain left in place ?Knee immobilizer to remain on out of bed can be loosened while in bed ?Once cultures start coming back hopeful for Discharged maybe later this afternoon or tomorrow morning. ? ? ? ?Patient's anticipated LOS is less than 2 midnights, meeting these requirements: ?- Younger than 65 ?- Lives  within 1 hour of care ?- Has a competent adult at home to recover with post-op recover ?- NO history of ? - Chronic pain requiring opiods ? - Diabetes ? - Coronary Artery Disease ? - Heart failure ? - Heart attack ? - Stroke ? - DVT/VTE ? - Cardiac arrhythmia ? - Respiratory Failure/COPD ? - Renal failure ? - Anemia ? - Advanced Liver disease ? ? ? ? ?Roberto Thomas ?05/19/2021, 7:24 AM ? ?

## 2021-05-19 NOTE — Progress Notes (Signed)
Transition of Care (TOC) Screening Note ? ?Patient Details  ?Name: Roberto Thomas ?Date of Birth: November 13, 1990 ? ?Transition of Care (TOC) CM/SW Contact:    ?Sherie Don, LCSW ?Phone Number: ?05/19/2021, 11:56 AM ? ?Transition of Care Department [facility]) has reviewed patient and no TOC needs have been identified at this time. We will continue to monitor patient advancement through interdisciplinary progression rounds. If new patient transition needs arise, please place a TOC consult. ?

## 2021-05-19 NOTE — Progress Notes (Signed)
Patient ID: Roberto Thomas, male   DOB: 30-Sep-1990, 31 y.o.   MRN: [id number] ? ?Dressing changed this afternoon. Drain removed. At time of removal looks like about 15 cc of fluid. ? ?Patient will be going home today. Will be switching to PO Linezolid 600 mg for 2 weeks. This is what was recommended per ID. ? ?Will follow up in our office in 2 weeks. ?KI on when OOB. ?Pain control discussed with the patient today.  Hopefully now that the drain is removed today will start to feel better.  Pain medication sent to the pharmacy for the patient. ?Aspirin 81 mg once daily for DVT prevention. ? ?

## 2021-05-19 NOTE — Progress Notes (Signed)
Chaplain engaged in an initial visit with Roberto Thomas.  Chaplain learned of Roberto Thomas's healthcare journey which led him to being hospitalized.  Chaplain learned about Roberto Thomas who describes himself as being a very active guy.  He enjoys golfing and being outside.  His recent surgery on his knee has been hard on him in that he enjoys being mobile.  Chaplain affirmed the transitions happening in Roberto Thomas's life right now. Roberto Thomas is looking forward to getting well and resuming those activities that he loves.  ? ?Chaplain built relationship and rapport, offered reflective listening, and support.  ? ? ? 05/19/21 1000  ?Clinical Encounter Type  ?Visited With Patient  ?Visit Type Initial;Spiritual support  ? ? ?

## 2021-05-19 NOTE — TOC Benefit Eligibility Note (Signed)
Patient Advocate Encounter ? ?Insurance verification completed.   ? ?The patient is currently admitted and upon discharge could be taking linezolid (Zyvox) 600 mg tablets. ? ?Requires Prior Authorization ? ?The patient is insured through [employer] ? ? ?Bao Coreas, CPhT ?Pharmacy Patient Advocate Specialist ?[facility] Patient Advocate Team ?Direct Number: [phone]  Fax: [phone] ? ? ? ? ? ?  ?

## 2021-05-19 NOTE — Evaluation (Addendum)
Physical Therapy Evaluation ?Patient Details ?Name: Roberto Thomas ?MRN: [id number] ?DOB: [birth year] ?Today's Date: 05/19/2021 ? ?History of Present Illness ? Patient is 31 y.o. male 5 week s/p Rt knee arthroscopy who began having draining from incision site and pain and swelling ~2 weeks ago. He is now s/p Rt Knee I&D on 05/18/21 for possible right knee synovitis, possible septic arthritis. PMH significant for GERD. ? ?  ?Clinical Impression ? Roberto Thomas is a 31 y.o. male POD 1 s/p Rt knee I&D. Patient reports independence with mobility at baseline. Patient is now limited by functional impairments (see PT problem list below) and requires supervision for transfers and gait with crutches. Patient was able to ambulate ~200 feet with crutches and supervision with min cues for safety. Patient educated on safe sequencing for stair mobility and verbalized safe guarding position for people assisting with mobility. Patient instructed in management of knee immobilizer and instructed on ankle pumps for edema control. Patient will benefit from continued skilled PT interventions to address impairments and progress towards PLOF; educated to discuss follow up therapy with MD on Friday at follow up visit. Patient has met mobility goals at safe level for discharge home and is safe to mobilize with RN/NT staff, no further acute PT needs.    ?   ? ?Recommendations for follow up therapy are one component of a multi-disciplinary discharge planning process, led by the attending physician.  Recommendations may be updated based on patient status, additional functional criteria and insurance authorization. ? ?Follow Up Recommendations Follow physician's recommendations for discharge plan and follow up therapies ? ?  ?Assistance Recommended at Discharge Set up Supervision/Assistance  ?Patient can return home with the following ? Assistance with cooking/housework;Help with stairs or ramp for entrance;Assist for transportation ? ?  ?Equipment  Recommendations None recommended by PT (pt has crutches)  ?Recommendations for Other Services ?    ?  ?Functional Status Assessment Patient has had a recent decline in their functional status and demonstrates the ability to make significant improvements in function in a reasonable and predictable amount of time.  ? ?  ?Precautions / Restrictions Precautions ?Precautions: Fall ?Required Braces or Orthoses: Knee Immobilizer - Right ?Knee Immobilizer - Right: On when out of bed or walking (can be loosened in bed) ?Restrictions ?Weight Bearing Restrictions: No ?Other Position/Activity Restrictions: WBAT  ? ?  ? ?Mobility ? Bed Mobility ?Overal bed mobility: Needs Assistance ?Bed Mobility: Supine to Sit ?  ?  ?Supine to sit: Supervision, HOB elevated ?  ?  ?General bed mobility comments: supervision for safety ?  ? ?Transfers ?Overall transfer level: Needs assistance ?Equipment used: Rolling walker (2 wheels) ?Transfers: Sit to/from Stand ?  ?  ?  ?  ?  ?  ?  ?  ? ?Ambulation/Gait ?Ambulation/Gait assistance: Supervision ?Gait Distance (Feet): 200 Feet ?Assistive device: Crutches ?Gait Pattern/deviations: Step-through pattern, Decreased stance time - right, Decreased weight shift to right ?Gait velocity: decr ?  ?  ?General Gait Details: cues for safety with sequencing and to shorten steps to improve balance ? ?Stairs ?Stairs: Yes ?Stairs assistance: Supervision ?Stair Management: One rail Left, Step to pattern, Forwards, With crutches ?Number of Stairs: 3 ?General stair comments: cues for sequencing "up with good, down with bad" and for crutch placement. no LOB noted, supervision for safety. ? ?Wheelchair Mobility ?  ? ?Modified Rankin (Stroke Patients Only) ?  ? ?  ? ?Balance Overall balance assessment: Needs assistance ?Sitting-balance support: Feet supported ?Sitting balance-Leahy Scale: Good ?  ?  ?  Standing balance support: During functional activity, Reliant on assistive device for balance, Bilateral upper  extremity supported ?Standing balance-Leahy Scale: Fair ?  ?  ?  ?  ?  ?  ?  ?  ?  ?  ?  ?  ?   ? ? ? ?Pertinent Vitals/Pain Pain Assessment ?Pain Assessment: Faces ?Faces Pain Scale: Hurts whole lot ?Pain Location: Rt knee ?Pain Descriptors / Indicators: Aching, Discomfort ?Pain Intervention(s): Limited activity within patient's tolerance, Monitored during session, Repositioned, Premedicated before session  ? ? ?Home Living Family/patient expects to be discharged to:: Private residence ?Living Arrangements: Spouse/significant other ?Available Help at Discharge: Friend(s);Family ?Type of Home: House ?Home Access: Stairs to enter ?Entrance Stairs-Rails: None ?Entrance Stairs-Number of Steps: 1 ?Alternate Level Stairs-Number of Steps: 16 ?Home Layout: Two level;Bed/bath upstairs ?Home Equipment: Crutches ?   ?  ?Prior Function Prior Level of Function : Independent/Modified Independent ?  ?  ?  ?  ?  ?  ?Mobility Comments: enjoys golf ?  ?  ? ? ?Hand Dominance  ? Dominant Hand: Right ? ?  ?Extremity/Trunk Assessment  ? Upper Extremity Assessment ?Upper Extremity Assessment: Overall WFL for tasks assessed ?  ? ?Lower Extremity Assessment ?Lower Extremity Assessment: Overall WFL for tasks assessed;RLE deficits/detail ?RLE: Unable to fully assess due to immobilization;Unable to fully assess due to pain ?  ? ?Cervical / Trunk Assessment ?Cervical / Trunk Assessment: Normal  ?Communication  ? Communication: No difficulties  ?Cognition Arousal/Alertness: Awake/alert ?Behavior During Therapy: [facility] for tasks assessed/performed ?Overall Cognitive Status: Within Functional Limits for tasks assessed ?  ?  ?  ?  ?  ?  ?  ?  ?  ?  ?  ?  ?  ?  ?  ?  ?  ?  ?  ? ?  ?General Comments General comments (skin integrity, edema, etc.): Reviewed safe donning/doffing of knee immobilizer and precautions ? ?  ?Exercises Total Joint Exercises ?Ankle Circles/Pumps: AROM, Both, 10 reps  ? ?Assessment/Plan  ?  ?PT Assessment All further PT needs can  be met in the next venue of care  ?PT Problem List Decreased strength;Decreased range of motion;Decreased activity tolerance;Decreased balance;Decreased mobility;Decreased knowledge of use of DME;Decreased safety awareness;Decreased knowledge of precautions;Pain ? ?   ?  ?PT Treatment Interventions     ? ?PT Goals (Current goals can be found in the Care Plan section)  ?Acute Rehab PT Goals ?Patient Stated Goal: get back to independence and golfing ?PT Goal Formulation: All assessment and education complete, DC therapy ?Time For Goal Achievement: 05/20/21 ?Potential to Achieve Goals: Good ? ?  ?Frequency   ?  ? ? ?Co-evaluation   ?  ?  ?  ?  ? ? ?  ?AM-PAC PT "6 Clicks" Mobility  ?Outcome Measure Help needed turning from your back to your side while in a flat bed without using bedrails?: None ?Help needed moving from lying on your back to sitting on the side of a flat bed without using bedrails?: None ?Help needed moving to and from a bed to a chair (including a wheelchair)?: A Little ?Help needed standing up from a chair using your arms (e.g., wheelchair or bedside chair)?: A Little ?Help needed to walk in hospital room?: A Little ?Help needed climbing 3-5 steps with a railing? : A Little ?6 Click Score: 20 ? ?  ?End of Session Equipment Utilized During Treatment: Gait belt ?Activity Tolerance: Patient tolerated treatment well ?Patient left: in chair;with call bell/phone within reach;with  chair alarm set;with family/visitor present ?Nurse Communication: Mobility status ?PT Visit Diagnosis: Muscle weakness (generalized) (M62.81);Unsteadiness on feet (R26.81);Difficulty in walking, not elsewhere classified (R26.2);Pain ?Pain - Right/Left: Right ?Pain - part of body: Knee ?  ? ?Time: 2229-7989 ?PT Time Calculation (min) (ACUTE ONLY): 27 min ? ? ?Charges:   PT Evaluation ?$PT Eval Low Complexity: 1 Low ?PT Treatments ?$Gait Training: 8-22 mins ?  ?   ? ? ?Gwynneth Albright PT, DPT ?Acute Rehabilitation Services ?Office  [phone] ?Pager [phone] ? ?05/19/21 12:36 PM  ? ?Jacques Navy ?05/19/2021, 12:36 PM ? ?

## 2021-05-19 NOTE — TOC Benefit Eligibility Note (Signed)
Patient Advocate Encounter ? ?Prior Authorization for Linezolid 600MG  tablets has been approved.   ? ?PA# ?Effective dates: 05/19/2021 through 06/18/2021 ? ?Patients co-pay is $10.00.  ? ? ? ?06/20/2021, CPhT ?Pharmacy Patient Advocate Specialist ?[facility] Patient Advocate Team ?Direct Number: [phone]  Fax: [phone]  ?

## 2021-05-19 NOTE — Consult Note (Signed)
?  [facility] for Infectious Disease  ? ? ?Date of Admission:  05/18/2021    ? ?Reason for Consult:possible knee infection ?    ?Referring Physician: Dr Theda Sers ? ?Current antibiotics: ?Ancef ? ? ?ASSESSMENT:   ? ?31 y.o. male admitted with: ? ?Possible right knee infection: Patient presenting after arthroscopic surgery in early April 2023 with continued serous drainage from incision.  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  No gross purulence or obvious active infection was noted, however, there is concern regarding joint contamination from the arthro-cutaneous fistula.  Cultures are currently pending and, of note, patient was on doxycycline for about 2 weeks prior to the OR. ? ?RECOMMENDATIONS:   ? ?Discussed IV vs oral therapy with patient.  At this time, given no obvious septic arthritis and OR findings overall reassuring, feel that we can safely do PO therapy with close follow up ?Check baseline ESR/CRP ?Would like to do Linezolid 679m BID x 2 weeks to start and follow closely.  Requires a PA and we are working on that ?Will arrange outpatient follow up for safety lab monitoring and determine if longer duration needed ?If we can sort out insurance coverage for linezolid then okay to DC from our perspective ?Follow up made with SJanene Madeiraon 5/30 at 2:30pm ? ? ?Principal Problem: ?  Infection of right knee ([place] ? ? ?MEDICATIONS:   ? ?Scheduled Meds: ? acetaminophen  1,000 mg Oral Q6H  ? enoxaparin (LOVENOX) injection  40 mg Subcutaneous Q24H  ? loratadine  10 mg Oral Daily  ? pantoprazole  40 mg Oral Daily  ? traMADol  50 mg Oral Q6H  ? ?Continuous Infusions: ?  ceFAZolin (ANCEF) IV 1 g (05/19/21 0301)  ? methocarbamol (ROBAXIN) IV Stopped (05/18/21 1850)  ? ?PRN Meds:.acetaminophen, bisacodyl, diphenhydrAMINE, HYDROmorphone (DILAUDID) injection, methocarbamol **OR** methocarbamol (ROBAXIN) IV, ondansetron **OR** ondansetron (ZOFRAN) IV, oxyCODONE, oxyCODONE, senna-docusate ? ?HPI:    ? ?Roberto KEECHis a 31y.o. male with PMHx as below. ? ?He had right knee arthroscopy partial medial meniscectomy in early April 2023.  He developed post-operative drainage.  [place] notes lateral incision with slight drainage but no sign of infection.  He continued to have drainage plus pain and swelling.  He was given oral antibiotics (clindamycin, doxycycline) without much relief.  [facility].  Prior to the OR, it is noted his inferolateral portal site is open with a bit of synovial fluid from it.  This appeared serous.  The joint was not warm or erythematous. 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  This did not appear to be an active infection but there was suspicion that the joint may have been contaminated by the arthro-cutaneous fistula.  He has a hemovac drain in place on right knee.  His OR cx are NGTD.   ? ? ?Past Medical History:  ?Diagnosis Date  ? Deviated septum   ? GERD (gastroesophageal reflux disease)   ? PONV (postoperative nausea and vomiting)   ? ? ?Social History  ? ?Tobacco Use  ? Smoking status: Former  ?  Types: Cigarettes  ? Smokeless tobacco: Never  ?Vaping Use  ? Vaping Use: Some days  ?Substance Use Topics  ? Alcohol use: Yes  ?  Comment: socially  ? Drug use: Yes  ?  Types: Marijuana  ?  Comment: Last use the weekend  ? ? ?Family History  ?Problem Relation Age of Onset  ? Hyperlipidemia Father   ? Hypertension Father   ? ? ?Allergies  ?Allergen Reactions  ? Naproxen Anaphylaxis  ? Penicillins Nausea And Vomiting  ?  GI upset  ? Sulfa Antibiotics Rash  ? ? ?Review of Systems  ?Constitutional: Negative.   ?Musculoskeletal:  Positive for joint pain.  ?All other systems reviewed and are negative. ? ?OBJECTIVE:  ? ?Blood pressure 117/74, pulse (!) 55, temperature 97.9 ?F (36.6 ?C), temperature source Oral, resp. rate 17, height 6' 2"   (1.88 m), weight 77.1 kg, SpO2 97 %. ?Body mass index is 21.83 kg/m?. ? ?Physical Exam ?Constitutional:   ?   General: He is not in acute distress. ?   Appearance: Normal appearance.  ?HENT:  ?   Head: Normocephalic and atraumatic.  ?Eyes:  ?   Extraocular Movements: Extraocular movements intact.  ?   Conjunctiva/sclera: Conjunctivae normal.  ?Pulmonary:  ?   Effort: Pulmonary effort is normal. No respiratory distress.  ?Abdominal:  ?   General: There is no distension.  ?   Palpations: Abdomen is soft.  ?Musculoskeletal:  ?   Cervical back: Normal range of motion and neck supple.  ?   Comments: Right knee in immobilizer.   ?Skin: ?   General: Skin is warm and dry.  ?   Findings: No rash.  ?Neurological:  ?   General: No focal deficit present.  ?   Mental Status: He is alert and oriented to person, place, and time.  ?Psychiatric:     ?   Mood and Affect: Mood normal.     ?   Behavior: Behavior normal.  ? ? ? ?Lab Results: ?Lab Results  ?Component Value Date  ? WBC 7.3 05/19/2021  ? HGB 14.4 05/19/2021  ? HCT 42.8 05/19/2021  ? MCV 88.6 05/19/2021  ? PLT 319 05/19/2021  ?  ?Lab Results  ?Component Value Date  ? NA 138 05/18/2021  ? K 3.9 05/18/2021  ? CO2 22 05/18/2021  ? GLUCOSE 99 05/18/2021  ? BUN 12 05/18/2021  ? CREATININE 1.03 05/18/2021  ? CALCIUM 10.1 05/18/2021  ? GFRNONAA >60 05/18/2021  ? GFRAA  06/15/2009  ?  >60        ?The eGFR has been calculated ?using the MDRD equation. ?This calculation has not been ?validated in all clinical ?situations. ?eGFR's persistently ?<60 mL/min signify ?possible Chronic Kidney Disease.  ?  ?Lab Results  ?Component Value Date  ? ALT 15 05/18/2021  ? AST 16 05/18/2021  ? ALKPHOS 49 05/18/2021  ? BILITOT 1.0 05/18/2021  ? ? ?No results found for: CRP ? ?No results found for: ESRSEDRATE ? ?I have reviewed the micro and lab results in Epic. ? ?Imaging: ?No results found.  ? ?Imaging independently reviewed in Epic. ? ?Mignon Pine ?[facility] [place] [facility] for [facility]ious Disease ?[place] ?[phone] pager ?05/19/2021, 8:25 AM ? ?I have personally spent 60 minutes involved in face-to-face and non-face-to-face activities for this patient on the day of the visit. Professional time spent includes the following activities: Preparing to see the patient (review of tests), Obtaining and/or reviewing separately obtained history (admission/discharge record), Performing a medically appropriate examination and/or evaluation , Ordering medications/tests/procedures, referring and communicating with other health care professionals, Documenting clinical information in the EMR, Independently interpreting results (not separately reported), Communicating results to the patient/family/caregiver, Counseling and educating the patient/family/caregiver and  Care coordination (not separately reported).  ? ? ?

## 2021-05-23 LAB — AEROBIC/ANAEROBIC CULTURE W GRAM STAIN (SURGICAL/DEEP WOUND)

## 2021-06-01 ENCOUNTER — Other Ambulatory Visit: Payer: Self-pay

## 2021-06-01 ENCOUNTER — Ambulatory Visit (INDEPENDENT_AMBULATORY_CARE_PROVIDER_SITE_OTHER): Payer: BC Managed Care – PPO | Admitting: Infectious Diseases

## 2021-06-01 ENCOUNTER — Encounter: Payer: Self-pay | Admitting: Infectious Diseases

## 2021-06-01 DIAGNOSIS — M009 Pyogenic arthritis, unspecified: Secondary | ICD-10-CM

## 2021-06-01 MED ORDER — CEFADROXIL 1 G PO TABS: 1.0000 g | ORAL_TABLET | Freq: Two times a day (BID) | ORAL | 0 refills | Status: DC

## 2021-06-01 MED ORDER — CEFADROXIL 1 G PO TABS
1.0000 g | ORAL_TABLET | Freq: Two times a day (BID) | ORAL | 0 refills | Status: AC
Start: 2021-06-01 — End: 2021-06-15

## 2021-06-01 NOTE — Assessment & Plan Note (Addendum)
Post-operative infection from previous scope tract with post-op fistula. Intraop report revealed no obvious intra-articular infection with repeat debridement and fistula excision. Cultures grew out tetracycline resistant coagulase negative staphylococcus species. On exam his incision is well healed and no signs of infection.  He has been doing well on linezolid with minimal side effects. Has allergy/intolerance to sulfa. Given higher risk for side effects/toxicicities beyond 2-3 weeks of use will switch to Cefadroxil BID 1 gm for 2 more weeks to complete 4 weeks total for possible septic arthritis of native knee. He did well with cefazolin intra-op and would presume he will do well with this outpatient.  He can follow up with Dr. Thomasena Edis for routine post op care. We are happy to see him back in ID clinic if there is any concern going forward (myself or Dr. Earlene Plater).

## 2021-06-01 NOTE — Patient Instructions (Signed)
Will finish out you treatment with 2 more weeks of antibiotics - will switch to an antibiotic called Cephadroxil - this will be 1 gram taken twice a day.  I sent in the request for the 1 gram tablets, but sometimes they are out of stock. The pharmacy may substitute to the 500 mg tablets instead. If that's the case take 2 together twice a day for 2 more weeks.   Follow up with Dr. Thomasena Edis for further care - happy to see you back if you or Dr. Thomasena Edis have any concerns going forward with your healing.   Very nice to meet you!

## 2021-06-01 NOTE — Progress Notes (Signed)
Patient: Roberto Thomas  DOB: 09/05/1990 MRN: [id number] PCP: Pcp, No   Chief Complaint  Patient presents with   Hospitalization Follow-up      Subjective:  Roberto Thomas is a 31 y.o. here for hospital follow up for possible infection of the right native knee following an arthroscopic procedure where he developed a sinus tract from a scope site.   Briefly, he had right knee arthroscopy partial medial meniscectomy in early April 2023. He developed post-operative drainage.  Clinic note in [place] on 4/24 notes lateral incision with slight drainage but no sign of infection.  He continued to have drainage plus pain and swelling.  He was given oral antibiotics (clindamycin, doxycycline) without much relief.  He presented to [facility] for elective debridement on 05/18/2021. No intra-articular infection but did have to excise a fistula.   Intraoperative cultures did grow out a coagulase negative staphylococcus species - rare staph cohnii that was tetracycline resistant. He has been doing well on linezolid BID with the only s/e of loose stools that is not too terrible for him to manage.  No fevers/chills.  Has appt with Dr. Theda Sers tomorrow for suture removal. Remains in the knee immobilizer.    Review of Systems  Constitutional:  Negative for fever and malaise/fatigue.  Respiratory:  Negative for cough.   Cardiovascular:  Negative for chest pain and leg swelling.  Gastrointestinal:  Positive for diarrhea. Negative for abdominal pain and vomiting.  Genitourinary:  Negative for dysuria.  Musculoskeletal:  Negative for neck pain. Joint pain: general soreness post op. Skin:  Negative for rash.    Past Medical History:  Diagnosis Date   Deviated septum    GERD (gastroesophageal reflux disease)    PONV (postoperative nausea and vomiting)     Outpatient Medications Prior to Visit  Medication Sig Dispense Refill   aspirin EC 81 MG tablet Take 81 mg by mouth 2 (two) times daily. Swallow  whole.     cetirizine (ZYRTEC ALLERGY) 10 MG tablet Take 1 tablet (10 mg total) by mouth daily. 30 tablet 0   ibuprofen (ADVIL) 200 MG tablet Take 400-600 mg by mouth every 6 (six) hours as needed for moderate pain.     linezolid (ZYVOX) 600 MG tablet Take 1 tablet (600 mg total) by mouth 2 (two) times daily for 14 days. 28 tablet 0   methocarbamol (ROBAXIN) 500 MG tablet Take 500 mg by mouth 4 (four) times daily as needed for muscle spasms.     oxyCODONE (ROXICODONE) 5 MG immediate release tablet Take 1 tablet (5 mg total) by mouth every 6 (six) hours as needed for moderate pain. 30 tablet 0   RABEprazole (ACIPHEX) 20 MG tablet Take 20 mg by mouth 2 (two) times daily.     oxyCODONE (OXY IR/ROXICODONE) 5 MG immediate release tablet Take 5 mg by mouth every 4 (four) hours as needed for pain. (Patient not taking: Reported on 06/01/2021)     promethazine (PHENERGAN) 25 MG tablet Take 25 mg by mouth every 4 (four) hours as needed. (Patient not taking: Reported on 06/01/2021)     No facility-administered medications prior to visit.     Allergies  Allergen Reactions   Naproxen Anaphylaxis   Penicillins Nausea And Vomiting    GI upset   Sulfa Antibiotics Rash    Social History   Tobacco Use   Smoking status: Former    Types: Cigarettes, Cigars   Smokeless tobacco: Never   Tobacco comments:  States he uses cigars just socially  Vaping Use   Vaping Use: Some days  Substance Use Topics   Alcohol use: Yes    Comment: socially   Drug use: Yes    Types: Marijuana    Comment: socially    Family History  Problem Relation Age of Onset   Hyperlipidemia Father    Hypertension Father     Objective:   Vitals:   06/01/21 1411  BP: 134/88  Pulse: 65  Temp: 98.4 F (36.9 C)  TempSrc: Oral  SpO2: 96%  Weight: 180 lb (81.6 kg)  Height: 6\' 2"  (1.88 m)   Body mass index is 23.11 kg/m.  Physical Exam Constitutional:      Appearance: Normal appearance.  Cardiovascular:     Rate  and Rhythm: Normal rate.  Pulmonary:     Effort: Pulmonary effort is normal.  Musculoskeletal:     Comments: Right knee with well healed incisions and sutures x 2 in place. No drainage or erythema. Everything looks well healed.   Neurological:     Mental Status: He is alert.    Lab Results: Lab Results  Component Value Date   WBC 7.3 05/19/2021   HGB 14.4 05/19/2021   HCT 42.8 05/19/2021   MCV 88.6 05/19/2021   PLT 319 05/19/2021    Lab Results  Component Value Date   CREATININE 1.03 05/18/2021   BUN 12 05/18/2021   NA 138 05/18/2021   K 3.9 05/18/2021   CL 105 05/18/2021   CO2 22 05/18/2021    Lab Results  Component Value Date   ALT 15 05/18/2021   AST 16 05/18/2021   ALKPHOS 49 05/18/2021   BILITOT 1.0 05/18/2021     Assessment & Plan:   Problem List Items Addressed This Visit       Unprioritized   Infection of right knee ([place])    Post-operative infection from previous scope tract with post-op fistula. Intraop report revealed no obvious intra-articular infection with repeat debridement and fistula excision. Cultures grew out tetracycline resistant coagulase negative staphylococcus species. On exam his incision is well healed and no signs of infection.  He has been doing well on linezolid with minimal side effects. Has allergy/intolerance to sulfa. Given higher risk for side effects/toxicicities beyond 2-3 weeks of use will switch to Cefadroxil BID 1 gm for 2 more weeks to complete 4 weeks total for possible septic arthritis of native knee. He did well with cefazolin intra-op and would presume he will do well with this outpatient.  He can follow up with Dr. Theda Sers for routine post op care. We are happy to see him back in ID clinic if there is any concern going forward (myself or Dr. Juleen China).        Relevant Medications   cefadroxil (DURICEF) 1 g tablet   Time taken to carefully review recent hospitalization, operative report, H&P, Discharge summary and  diagnostics available during that time.    Janene Madeira, MSN, NP-C [facility] for Infectious [place] Pager: [phone] Office: [phone]  06/01/21  2:43 PM 2

## 2021-06-30 DIAGNOSIS — M25561 Pain in right knee: Secondary | ICD-10-CM | POA: Diagnosis not present

## 2021-07-08 DIAGNOSIS — M25561 Pain in right knee: Secondary | ICD-10-CM | POA: Diagnosis not present

## 2021-07-13 DIAGNOSIS — M25561 Pain in right knee: Secondary | ICD-10-CM | POA: Diagnosis not present

## 2021-07-16 DIAGNOSIS — M25561 Pain in right knee: Secondary | ICD-10-CM | POA: Diagnosis not present

## 2021-07-20 DIAGNOSIS — M25561 Pain in right knee: Secondary | ICD-10-CM | POA: Diagnosis not present

## 2021-07-22 DIAGNOSIS — M25561 Pain in right knee: Secondary | ICD-10-CM | POA: Diagnosis not present

## 2021-07-27 DIAGNOSIS — M25561 Pain in right knee: Secondary | ICD-10-CM | POA: Diagnosis not present

## 2021-07-29 DIAGNOSIS — M25561 Pain in right knee: Secondary | ICD-10-CM | POA: Diagnosis not present

## 2021-08-02 DIAGNOSIS — M25561 Pain in right knee: Secondary | ICD-10-CM | POA: Diagnosis not present

## 2021-08-05 DIAGNOSIS — K219 Gastro-esophageal reflux disease without esophagitis: Secondary | ICD-10-CM | POA: Diagnosis not present

## 2021-08-06 DIAGNOSIS — M25561 Pain in right knee: Secondary | ICD-10-CM | POA: Diagnosis not present

## 2021-08-09 DIAGNOSIS — M25561 Pain in right knee: Secondary | ICD-10-CM | POA: Diagnosis not present

## 2021-08-11 DIAGNOSIS — M25561 Pain in right knee: Secondary | ICD-10-CM | POA: Diagnosis not present

## 2021-08-16 DIAGNOSIS — M25561 Pain in right knee: Secondary | ICD-10-CM | POA: Diagnosis not present

## 2021-08-18 DIAGNOSIS — M25561 Pain in right knee: Secondary | ICD-10-CM | POA: Diagnosis not present

## 2021-08-23 DIAGNOSIS — M25561 Pain in right knee: Secondary | ICD-10-CM | POA: Diagnosis not present

## 2021-08-25 DIAGNOSIS — M25561 Pain in right knee: Secondary | ICD-10-CM | POA: Diagnosis not present

## 2021-08-30 DIAGNOSIS — M25561 Pain in right knee: Secondary | ICD-10-CM | POA: Diagnosis not present

## 2021-09-01 DIAGNOSIS — M25561 Pain in right knee: Secondary | ICD-10-CM | POA: Diagnosis not present

## 2022-01-10 ENCOUNTER — Encounter (HOSPITAL_COMMUNITY): Payer: Self-pay | Admitting: *Deleted

## 2022-01-10 ENCOUNTER — Other Ambulatory Visit: Payer: Self-pay

## 2022-01-10 ENCOUNTER — Ambulatory Visit (HOSPITAL_COMMUNITY)
Admission: EM | Admit: 2022-01-10 | Discharge: 2022-01-10 | Disposition: A | Discharge status: Home / Self Care | Payer: BC Managed Care – PPO | Attending: Emergency Medicine | Admitting: Emergency Medicine

## 2022-01-10 DIAGNOSIS — K219 Gastro-esophageal reflux disease without esophagitis: Secondary | ICD-10-CM | POA: Diagnosis not present

## 2022-01-10 DIAGNOSIS — J069 Acute upper respiratory infection, unspecified: Secondary | ICD-10-CM | POA: Diagnosis not present

## 2022-01-10 DIAGNOSIS — U071 COVID-19: Secondary | ICD-10-CM | POA: Diagnosis not present

## 2022-01-10 LAB — SARS CORONAVIRUS 2 (TAT 6-24 HRS): SARS Coronavirus 2: POSITIVE — AB

## 2022-01-10 MED ORDER — GUAIFENESIN ER 600 MG PO TB12: 600.0000 mg | ORAL_TABLET | Freq: Two times a day (BID) | ORAL | 0 refills | Status: AC

## 2022-01-10 MED ORDER — DEXTROMETHORPHAN POLISTIREX ER 30 MG/5ML PO SUER: 60.0000 mg | Freq: Two times a day (BID) | ORAL | 0 refills | Status: AC | PRN

## 2022-01-10 MED ORDER — FLUTICASONE PROPIONATE 50 MCG/ACT NA SUSP: 1.0000 | Freq: Every day | NASAL | 2 refills | Status: AC

## 2022-01-10 NOTE — Discharge Instructions (Addendum)
I recommend symptomatic care for cough and congestion. You can use nasal spray daily to reduce ear pressure/nasal congestion. Drink lots of fluids with the mucinex pills. Use inhaler every 6 hours as needed for the next several days.  Cough may linger for several weeks. Please return if there is no improvement.

## 2022-01-10 NOTE — ED Provider Notes (Signed)
[facility]    CSN: [id number] Arrival date & time: 01/10/22  0848     History   Chief Complaint Chief Complaint  Patient presents with   Fever   Nasal Congestion   Sore Throat    HPI Roberto Thomas is a 32 y.o. male.  Presents with 3 day history of cough and congestion  Started after he returned from a cruise Sore throat, improved with advil Ear pressure./congestion. Worse after flying home No fevers but felt very hot last night No vomiting/diarrhea  Took nyquil last night that made him hallucinate Sick contacts on cruise, girlfriend also sick   History of reactive airway, has inhaler at home  Past Medical History:  Diagnosis Date   Deviated septum    GERD (gastroesophageal reflux disease)    PONV (postoperative nausea and vomiting)     Patient Active Problem List   Diagnosis Date Noted   Infection of right knee (HCC) 05/18/2021   Nasal septal deviation 12/19/2012   REACTIVE AIRWAY DISEASE 01/08/2010   COUGH 01/08/2010   ACID REFLUX DISEASE 10/20/2008   ADVERSE DRUG REACTION 10/20/2008    Past Surgical History:  Procedure Laterality Date   FRACTURE SURGERY Right    KNEE ARTHROSCOPY Right    KNEE ARTHROSCOPY Right 05/18/2021   Procedure: ARTHROSCOPY KNEE IRRIGATION AND DEBRIDEMENT;  Surgeon: Eugenia Mcalpine, MD;  Location: [facility];  Service: Orthopedics;  Laterality: Right;  60   NASAL SEPTOPLASTY W/ TURBINOPLASTY Bilateral 12/19/2012   Procedure: BILATERAL NASAL SEPTOPLASTY WITH BIALTERAL TURBINATE REDUCTION;  Surgeon: Osborn Coho, MD;  Location: [facility];  Service: ENT;  Laterality: Bilateral;   SHOULDER ARTHROSCOPY Left    WISDOM TOOTH EXTRACTION      Home Medications    Prior to Admission medications   Medication Sig Start Date End Date Taking? Authorizing Provider  dextromethorphan (DELSYM) 30 MG/5ML liquid Take 10 mLs (60 mg total) by mouth 2 (two) times daily as needed for cough. 01/10/22  Yes Lynton Crescenzo, Lurena Joiner, PA-C   fluticasone (FLONASE) 50 MCG/ACT nasal spray Place 1 spray into both nostrils daily. 01/10/22  Yes Mujahid Jalomo, Lurena Joiner, PA-C  guaiFENesin (MUCINEX) 600 MG 12 hr tablet Take 1 tablet (600 mg total) by mouth 2 (two) times daily for 5 days. 01/10/22 01/15/22 Yes Ajanee Buren, Ray Church  aspirin EC 81 MG tablet Take 81 mg by mouth 2 (two) times daily. Swallow whole.    [provider]  ibuprofen (ADVIL) 200 MG tablet Take 400-600 mg by mouth every 6 (six) hours as needed for moderate pain.    [provider]  methocarbamol (ROBAXIN) 500 MG tablet Take 500 mg by mouth 4 (four) times daily as needed for muscle spasms. 04/13/21   [provider]  RABEprazole (ACIPHEX) 20 MG tablet Take 20 mg by mouth 2 (two) times daily.    [provider]    Family History Family History  Problem Relation Age of Onset   Hyperlipidemia Father    Hypertension Father     Social History Social History   Tobacco Use   Smoking status: Former    Types: Cigarettes, Cigars   Smokeless tobacco: Never   Tobacco comments:    States he uses cigars just socially  Vaping Use   Vaping Use: Some days  Substance Use Topics   Alcohol use: Yes    Comment: socially   Drug use: Yes    Types: Marijuana    Comment: socially     Allergies   Naproxen,  Penicillins, and Sulfa antibiotics   Review of Systems Review of Systems As per HPI  Physical Exam Triage Vital Signs ED Triage Vitals  Enc Vitals Group     BP 01/10/22 1010 134/84     Pulse Rate 01/10/22 1010 92     Resp 01/10/22 1010 18     Temp 01/10/22 1010 99.3 F (37.4 C)     Temp src --      SpO2 01/10/22 1010 99 %     Weight --      Height --      Head Circumference --      Peak Flow --      Pain Score 01/10/22 1007 8     Pain Loc --      Pain Edu? --      Excl. in [place]? --    No data found.  Updated Vital Signs BP 134/84   Pulse 92   Temp 99.3 F (37.4 C)   Resp 18   SpO2 99%    Physical Exam Vitals and nursing  note reviewed.  Constitutional:      General: He is not in acute distress. HENT:     Right Ear: Tympanic membrane and ear canal normal.     Left Ear: Tympanic membrane and ear canal normal.     Ears:     Comments: Clear fluid    Nose: Congestion present.     Mouth/Throat:     Mouth: Mucous membranes are moist.     Pharynx: Uvula midline. No posterior oropharyngeal erythema.     Tonsils: No tonsillar exudate or tonsillar abscesses.  Eyes:     Conjunctiva/sclera: Conjunctivae normal.     Pupils: Pupils are equal, round, and reactive to light.  Cardiovascular:     Rate and Rhythm: Normal rate and regular rhythm.     Heart sounds: Normal heart sounds.  Pulmonary:     Effort: Pulmonary effort is normal. No respiratory distress.     Breath sounds: Normal breath sounds. No wheezing.     Comments: Clear throughout  Musculoskeletal:     Cervical back: Normal range of motion.  Lymphadenopathy:     Cervical: No cervical adenopathy.  Skin:    General: Skin is warm and dry.     Findings: No rash.  Neurological:     Mental Status: He is alert and oriented to person, place, and time.     UC Treatments / Results  Labs (all labs ordered are listed, but only abnormal results are displayed) Labs Reviewed  SARS CORONAVIRUS 2 (TAT 6-24 HRS)    EKG  Radiology No results found.  Procedures Procedures   Medications Ordered in UC Medications - No data to display  Initial Impression / Assessment and Plan / UC Course  I have reviewed the triage vital signs and the nursing notes.  Pertinent labs & imaging results that were available during my care of the patient were reviewed by me and considered in my medical decision making (see chart for details).  Covid test pending. If positive, candidate for Paxlovid. GFR >60  Likely viral URI. Recommend symptomatic care. Mucinex, delsym, flonase, fluids. Can continue ibu or tylenol as needed for aches. Discussed he can use inhaler at home q6  hours as needed. Understands cough may linger for several weeks but should improve. Return precautions discussed. Patient agrees to plan  Final Clinical Impressions(s) / UC Diagnoses   Final diagnoses:  Viral URI with cough  Discharge Instructions      I recommend symptomatic care for cough and congestion. You can use nasal spray daily to reduce ear pressure/nasal congestion. Drink lots of fluids with the mucinex pills. Use inhaler every 6 hours as needed for the next several days.  Cough may linger for several weeks. Please return if there is no improvement.      ED Prescriptions     Medication Sig Dispense Auth. Provider   guaiFENesin (MUCINEX) 600 MG 12 hr tablet Take 1 tablet (600 mg total) by mouth 2 (two) times daily for 5 days. 10 tablet Mitchell Epling, PA-C   dextromethorphan (DELSYM) 30 MG/5ML liquid Take 10 mLs (60 mg total) by mouth 2 (two) times daily as needed for cough. 89 mL Kacie Huxtable, PA-C   fluticasone (FLONASE) 50 MCG/ACT nasal spray Place 1 spray into both nostrils daily. 11.1 mL Dava Rensch, Lurena Joiner, PA-C      PDMP not reviewed this encounter.   Coralynn Gaona, Lurena Joiner, [place] 01/10/22 1058

## 2022-01-10 NOTE — ED Triage Notes (Signed)
Pt reports cough and congestion started 2 days ago after returning from a cruise.

## 2022-05-16 DIAGNOSIS — J329 Chronic sinusitis, unspecified: Secondary | ICD-10-CM | POA: Diagnosis not present

## 2022-05-16 DIAGNOSIS — R439 Unspecified disturbances of smell and taste: Secondary | ICD-10-CM | POA: Diagnosis not present

## 2022-05-17 ENCOUNTER — Encounter: Payer: Self-pay | Admitting: Physician Assistant

## 2022-06-16 ENCOUNTER — Other Ambulatory Visit: Payer: Self-pay | Admitting: Physician Assistant

## 2022-06-16 DIAGNOSIS — J329 Chronic sinusitis, unspecified: Secondary | ICD-10-CM

## 2022-10-11 DIAGNOSIS — J0191 Acute recurrent sinusitis, unspecified: Secondary | ICD-10-CM | POA: Diagnosis not present

## 2022-10-14 DIAGNOSIS — J329 Chronic sinusitis, unspecified: Secondary | ICD-10-CM | POA: Diagnosis not present

## 2022-10-14 DIAGNOSIS — J342 Deviated nasal septum: Secondary | ICD-10-CM | POA: Diagnosis not present

## 2022-10-14 DIAGNOSIS — J341 Cyst and mucocele of nose and nasal sinus: Secondary | ICD-10-CM | POA: Diagnosis not present

## 2022-10-17 DIAGNOSIS — K219 Gastro-esophageal reflux disease without esophagitis: Secondary | ICD-10-CM | POA: Diagnosis not present

## 2022-12-13 ENCOUNTER — Encounter: Payer: Self-pay | Admitting: Allergy and Immunology

## 2022-12-13 ENCOUNTER — Ambulatory Visit: Payer: BC Managed Care – PPO | Admitting: Allergy and Immunology

## 2022-12-13 ENCOUNTER — Other Ambulatory Visit: Payer: Self-pay

## 2022-12-13 VITALS — BP 126/82 | HR 87 | Temp 98.0°F | Resp 18 | Ht 70.87 in | Wt 189.4 lb

## 2022-12-13 DIAGNOSIS — R442 Other hallucinations: Secondary | ICD-10-CM | POA: Diagnosis not present

## 2022-12-13 DIAGNOSIS — J3089 Other allergic rhinitis: Secondary | ICD-10-CM | POA: Diagnosis not present

## 2022-12-13 DIAGNOSIS — K219 Gastro-esophageal reflux disease without esophagitis: Secondary | ICD-10-CM | POA: Diagnosis not present

## 2022-12-13 MED ORDER — FAMOTIDINE 40 MG PO TABS: 40.0000 mg | ORAL_TABLET | Freq: Every evening | ORAL | 1 refills | Status: AC

## 2022-12-13 MED ORDER — RYALTRIS 665-25 MCG/ACT NA SUSP: 2.0000 | Freq: Two times a day (BID) | NASAL | 1 refills | Status: AC

## 2022-12-13 MED ORDER — LEVOCETIRIZINE DIHYDROCHLORIDE 5 MG PO TABS: 5.0000 mg | ORAL_TABLET | Freq: Every day | ORAL | 1 refills | Status: AC | PRN

## 2022-12-13 NOTE — Patient Instructions (Signed)
  1. Return for skin testing without antihistamine use  2. Treat and prevent inflammation of airway:   A. Ryaltris - 2 sprays each nostril 2 times per day (SP)  3. Treat and prevent reflux induced inflammation of airway:   A. Continue Aciphex 20 mg - 1 tablet 2 times per day  B. Start famotidine 40 mg - 1 tablet in evening  C. Decrease use of caffeine, chocolate, alcohol  D. Replace throat clearing with drinking / swallowing maneuver  E. Nissen fundoplication???   4. If needed:   A. Nasal saline  B. OTC antihistamine

## 2022-12-13 NOTE — Progress Notes (Unsigned)
[place] [facility] [place] [facility] [place] [facility] [place] [facility] [place]   NEW PATIENT NOTE  Referring Provider: No ref. provider found Primary Provider: Pcp, No Date of office visit: 12/13/2022    Subjective:   Chief Complaint:  Roberto Thomas (DOB: [birth year]) is a 32 y.o. male who presents to the clinic on 12/13/2022 with a chief complaint of No chief complaint on file. Marland Kitchen     HPI: Roberto Thomas presents to this clinic in evaluation of respiratory tract problems.  He has noted that ever since he contracted COVID in January 2024, which involved both his upper and lower airways, he has had persistent upper airway symptoms.  He has had some ugly nasal discharge and some sneezing and stuffiness and feeling as though there is "cement" in his upper airway along with the smell of must and mold.  He has been treated with antibiotics and various other agents and still continues to have this problem.  He has been seen by ENT who performed a CT scan of his sinuses which apparently was normal.  He also has a lot of throat clearing with coughing and postnasal drip and intermittent raspy voice.  He has horrendous reflux that is still active even while using Aciphex twice a day.  He has regurgitation.  He drinks 1 coffee in the morning and 1 energy drink in the daytime and has alcohol about every day usually at a low amount of 1 drink per day.  Intermittently he consumes chocolate.  Past Medical History:  Diagnosis Date   Deviated septum    GERD (gastroesophageal reflux disease)    PONV (postoperative nausea and vomiting)    Recurrent upper respiratory infection (URI)     Past Surgical History:  Procedure Laterality Date   FRACTURE SURGERY Right    KNEE ARTHROSCOPY Right    KNEE ARTHROSCOPY Right 05/18/2021   Procedure: ARTHROSCOPY KNEE IRRIGATION AND DEBRIDEMENT;  Surgeon: Eugenia Mcalpine, MD;  Location: [facility];  Service: Orthopedics;  Laterality: Right;  60   NASAL SEPTOPLASTY W/ TURBINOPLASTY Bilateral 12/19/2012    Procedure: BILATERAL NASAL SEPTOPLASTY WITH BIALTERAL TURBINATE REDUCTION;  Surgeon: Osborn Coho, MD;  Location: [facility];  Service: ENT;  Laterality: Bilateral;   SHOULDER ARTHROSCOPY Left    WISDOM TOOTH EXTRACTION      Allergies as of 12/13/2022       Reactions   Naproxen Anaphylaxis   Penicillins Nausea And Vomiting   GI upset   Sulfa Antibiotics Rash        Medication List    aspirin EC 81 MG tablet Take 81 mg by mouth 2 (two) times daily. Swallow whole.   dextromethorphan 30 MG/5ML liquid Commonly known as: Delsym Take 10 mLs (60 mg total) by mouth 2 (two) times daily as needed for cough.   famotidine 40 MG tablet Commonly known as: PEPCID Take 1 tablet (40 mg total) by mouth at bedtime. Started by: Abbeygail Igoe J Carmon Brigandi   fluticasone 50 MCG/ACT nasal spray Commonly known as: FLONASE Place 1 spray into both nostrils daily.   ibuprofen 200 MG tablet Commonly known as: ADVIL Take 400-600 mg by mouth every 6 (six) hours as needed for moderate pain.   levocetirizine 5 MG tablet Commonly known as: XYZAL Take 1 tablet (5 mg total) by mouth daily as needed for allergies (Can take an extra dose during flare ups.). Started by: Quamesha Mullet J Willy Pinkerton   methocarbamol 500 MG tablet Commonly known as: ROBAXIN Take 500 mg by mouth 4 (four) times  daily as needed for muscle spasms.   RABEprazole 20 MG tablet Commonly known as: ACIPHEX Take 20 mg by mouth 2 (two) times daily.   Ryaltris 409-81 MCG/ACT Susp Generic drug: Olopatadine-Mometasone Place 2 sprays into the nose 2 (two) times daily. Started by: Ryzen Deady J Arseniy Toomey   ZyrTEC Allergy 10 MG Caps Generic drug: Cetirizine HCl Take 1 capsule by mouth daily.    Review of systems negative except as noted in HPI / PMHx or noted below:  Review of Systems  Constitutional: Negative.   HENT: Negative.    Eyes: Negative.   Respiratory: Negative.    Cardiovascular: Negative.   Gastrointestinal: Negative.    Genitourinary: Negative.   Musculoskeletal: Negative.   Skin: Negative.   Neurological: Negative.   Endo/Heme/Allergies: Negative.   Psychiatric/Behavioral: Negative.      Family History  Problem Relation Age of Onset   Hyperlipidemia Father    Hypertension Father     Social History   Socioeconomic History   Marital status: Single    Spouse name: Not on file   Number of children: Not on file   Years of education: Not on file   Highest education level: Not on file  Occupational History   Not on file  Tobacco Use   Smoking status: Former    Types: Cigarettes, Cigars   Smokeless tobacco: Never   Tobacco comments:    States he uses cigars just socially  Vaping Use   Vaping status: Some Days  Substance and Sexual Activity   Alcohol use: Yes    Comment: socially   Drug use: Yes    Types: Marijuana    Comment: socially   Sexual activity: Yes  Other Topics Concern   Not on file  Social History Narrative   Not on file   Environmental and Social history  Lives in a house with a dry environment, no animals located inside the household, no carpet in the bedroom, no plastic on the bed, no plastic on the pillow, no smoking ongoing with inside the household.  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 Objective:   Vitals:   12/13/22 0943  BP: 126/82  Pulse: 87  Resp: 18  Temp: 98 F (36.7 C)  SpO2: 98%   Height: 5' 10.87" (180 cm) Weight: 189 lb 6.4 oz (85.9 kg)  Physical Exam Constitutional:      Appearance: He is not diaphoretic.  HENT:     Head: Normocephalic.     Right Ear: Tympanic membrane, ear canal and external ear normal.     Left Ear: Tympanic membrane, ear canal and external ear normal.     Nose: Nose normal. No mucosal edema or rhinorrhea.     Mouth/Throat:     Pharynx: Uvula midline. No oropharyngeal exudate.  Eyes:     Conjunctiva/sclera: Conjunctivae normal.  Neck:     Thyroid: No thyromegaly.     Trachea: Trachea normal. No  tracheal tenderness or tracheal deviation.  Cardiovascular:     Rate and Rhythm: Normal rate and regular rhythm.     Heart sounds: Normal heart sounds, S1 normal and S2 normal. No murmur heard. Pulmonary:     Effort: No respiratory distress.     Breath sounds: Normal breath sounds. No stridor. No wheezing or rales.  Lymphadenopathy:     Head:     Right side of head: No tonsillar adenopathy.     Left side of head: No tonsillar adenopathy.     Cervical:  No cervical adenopathy.  Skin:    Findings: No erythema or rash.     Nails: There is no clubbing.  Neurological:     Mental Status: He is alert.     Diagnostics: Allergy skin tests were not performed.   Results of a sinus CT scan obtained 16 November 2012 identified the following:  The scan does demonstrate deviation of the septum in the left for  direction extending from front to back. Extent measures 3-4 mm.  Nasal passages appear sufficiently patent at this moment. Frontal  sinuses are hypoplastic. The ethmoid sinuses are clear. The sphenoid  sinus is clear. The right maxillary sinus is clear. There is mucosal  thickening along the roof and anterior wall of the left maxillary  sinus. Some soft tissue thickening is present in the region of the  infundibulum on the left. No free fluid.   Results of a sinus CT scan obtained 10 November 2022 identified the following:  Paranasal sinuses: Frontal: Hypoplastic, especially the right frontal sinus, but aerated. Ethmoid: Clear. Maxillary: Clear except for a tiny right maxillary mucous retention cyst series 2, image 72, new since 2014. Sphenoid: Clear aside from trace mucosal thickening at the right sphenoid ostia (series 1, image 50). Right ostiomeatal unit: Patent, coronal image 62. Left ostiomeatal unit: Patent, coronal image 63. Nasal passages: Nasal septum is intact, with minimal septal deviation. Some generalized nasal cavity mucosal thickening, although overall the pattern could  be normal (coronal image 62). Olfactory recesses are patent. No retained secretions. Anatomy: Anterior ethmoidal artery position on coronal image 71 with no pneumatization above either notch. Symmetric and intact olfactory grooves and fovea ethmoidalis, Keros II (4-41mm). Sellar sphenoid pneumatization pattern. No clinoid process pneumatization. No Onodi cell. Other: No acute maxillary dental finding. Visible tympanic cavities appear clear. Nasopharynx soft tissue contour is normal. No acute osseous abnormality identified.   Spirometry was performed and demonstrated an FEV1 of 4.48 @ 102 % of predicted. FEV1/FVC = 0.76   Assessment and Plan:    1. Other allergic rhinitis   2. Phantosmia   3. LPRD (laryngopharyngeal reflux disease)    1. Return for skin testing without antihistamine use  2. Treat and prevent inflammation of airway:   A. Ryaltris - 2 sprays each nostril 2 times per day (SP)  3. Treat and prevent reflux induced inflammation of airway:   A. Continue Aciphex 20 mg - 1 tablet 2 times per day  B. Start famotidine 40 mg - 1 tablet in evening  C. Decrease use of caffeine, chocolate, alcohol  D. Replace throat clearing with drinking / swallowing maneuver  E. Nissen fundoplication???   4. If needed:   A. Nasal saline  B. OTC antihistamine  Roberto Thomas appears to have consistent inflammation of his airway most likely based upon atopic disease and we will work through this issue by having him return for skin test and he will use a combination nasal steroid/nasal antihistamine as noted above.  He also has a component of reflux which is probably giving rise to some airway inflammation as well and I have given him a plan of therapy to utilize as noted above.  I will see him back in this clinic for skin testing.  Jessica Priest, MD Allergy / Immunology [facility]

## 2022-12-14 ENCOUNTER — Encounter: Payer: Self-pay | Admitting: Allergy and Immunology

## 2023-01-03 ENCOUNTER — Ambulatory Visit: Payer: BC Managed Care – PPO | Admitting: Allergy and Immunology

## 2023-01-24 ENCOUNTER — Ambulatory Visit: Payer: BC Managed Care – PPO | Admitting: Allergy and Immunology

## 2023-01-31 ENCOUNTER — Ambulatory Visit: Payer: BC Managed Care – PPO | Admitting: Allergy and Immunology

## 2023-01-31 DIAGNOSIS — J3089 Other allergic rhinitis: Secondary | ICD-10-CM

## 2023-02-01 ENCOUNTER — Encounter: Payer: Self-pay | Admitting: Allergy and Immunology

## 2023-02-01 NOTE — Progress Notes (Signed)
Roberto Thomas returns to this clinic to have skin testing performed.  He did not demonstrate any hypersensitivity against a screening panel of aeroallergens.

## 2023-03-22 DIAGNOSIS — R21 Rash and other nonspecific skin eruption: Secondary | ICD-10-CM | POA: Diagnosis not present

## 2023-03-22 DIAGNOSIS — L959 Vasculitis limited to the skin, unspecified: Secondary | ICD-10-CM | POA: Diagnosis not present

## 2023-03-22 DIAGNOSIS — L300 Nummular dermatitis: Secondary | ICD-10-CM | POA: Diagnosis not present

## 2023-04-12 DIAGNOSIS — L2089 Other atopic dermatitis: Secondary | ICD-10-CM | POA: Diagnosis not present

## 2023-04-12 DIAGNOSIS — L920 Granuloma annulare: Secondary | ICD-10-CM | POA: Diagnosis not present

## 2023-10-02 DIAGNOSIS — K219 Gastro-esophageal reflux disease without esophagitis: Secondary | ICD-10-CM | POA: Diagnosis not present
